# Patient Record
Sex: Female | Born: 1975 | Race: Black or African American | Hispanic: No | Marital: Married | Smoking: Never smoker
Health system: Southern US, Community
[De-identification: ages and names within clinical notes are randomized; demographics above are authoritative.]

## PROBLEM LIST (undated history)

## (undated) DIAGNOSIS — R12 Heartburn: Secondary | ICD-10-CM

## (undated) DIAGNOSIS — B977 Papillomavirus as the cause of diseases classified elsewhere: Secondary | ICD-10-CM

## (undated) DIAGNOSIS — N289 Disorder of kidney and ureter, unspecified: Secondary | ICD-10-CM

## (undated) DIAGNOSIS — T7840XA Allergy, unspecified, initial encounter: Secondary | ICD-10-CM

## (undated) DIAGNOSIS — IMO0002 Reserved for concepts with insufficient information to code with codable children: Secondary | ICD-10-CM

## (undated) DIAGNOSIS — R0602 Shortness of breath: Secondary | ICD-10-CM

## (undated) DIAGNOSIS — E0590 Thyrotoxicosis, unspecified without thyrotoxic crisis or storm: Secondary | ICD-10-CM

## (undated) DIAGNOSIS — I10 Essential (primary) hypertension: Secondary | ICD-10-CM

## (undated) DIAGNOSIS — M549 Dorsalgia, unspecified: Secondary | ICD-10-CM

## (undated) DIAGNOSIS — Z86718 Personal history of other venous thrombosis and embolism: Secondary | ICD-10-CM

## (undated) DIAGNOSIS — D649 Anemia, unspecified: Secondary | ICD-10-CM

## (undated) DIAGNOSIS — E559 Vitamin D deficiency, unspecified: Secondary | ICD-10-CM

## (undated) DIAGNOSIS — R600 Localized edema: Secondary | ICD-10-CM

## (undated) HISTORY — DX: Vitamin D deficiency, unspecified: E55.9

## (undated) HISTORY — DX: Shortness of breath: R06.02

## (undated) HISTORY — DX: Dorsalgia, unspecified: M54.9

## (undated) HISTORY — DX: Disorder of kidney and ureter, unspecified: N28.9

## (undated) HISTORY — DX: Allergy, unspecified, initial encounter: T78.40XA

## (undated) HISTORY — DX: Heartburn: R12

## (undated) HISTORY — DX: Papillomavirus as the cause of diseases classified elsewhere: B97.7

## (undated) HISTORY — DX: Essential (primary) hypertension: I10

## (undated) HISTORY — DX: Localized edema: R60.0

## (undated) HISTORY — PX: HERNIA REPAIR: SHX51

## (undated) HISTORY — DX: Reserved for concepts with insufficient information to code with codable children: IMO0002

## (undated) HISTORY — DX: Anemia, unspecified: D64.9

## (undated) HISTORY — DX: Personal history of other venous thrombosis and embolism: Z86.718

---

## 2004-03-31 ENCOUNTER — Other Ambulatory Visit: Admission: RE | Admit: 2004-03-31 | Discharge: 2004-03-31 | Payer: Self-pay | Admitting: Family Medicine

## 2007-05-02 ENCOUNTER — Encounter: Admission: RE | Admit: 2007-05-02 | Discharge: 2007-05-02 | Payer: Self-pay | Admitting: Nephrology

## 2007-05-07 ENCOUNTER — Encounter (HOSPITAL_COMMUNITY): Admission: RE | Admit: 2007-05-07 | Discharge: 2007-06-30 | Payer: Self-pay | Admitting: Nephrology

## 2007-08-08 ENCOUNTER — Ambulatory Visit (HOSPITAL_BASED_OUTPATIENT_CLINIC_OR_DEPARTMENT_OTHER): Admission: RE | Admit: 2007-08-08 | Discharge: 2007-08-08 | Payer: Self-pay | Admitting: General Surgery

## 2008-01-06 ENCOUNTER — Encounter: Admission: RE | Admit: 2008-01-06 | Discharge: 2008-01-06 | Payer: Self-pay | Admitting: Obstetrics and Gynecology

## 2008-09-24 HISTORY — PX: MYOMECTOMY: SHX85

## 2009-02-02 ENCOUNTER — Encounter: Admission: RE | Admit: 2009-02-02 | Discharge: 2009-02-02 | Payer: Self-pay | Admitting: Obstetrics and Gynecology

## 2009-02-11 ENCOUNTER — Encounter: Admission: RE | Admit: 2009-02-11 | Discharge: 2009-02-11 | Payer: Self-pay | Admitting: Obstetrics and Gynecology

## 2009-03-24 IMAGING — MG MM DIAGNOSTIC BILATERAL
8 of 9 series · 8 of 9 positions shown · non-contrast
Comparison: none

DG DIAGNOSTIC BILATERAL
Bilateral CC and MLO view(s) were taken.

RIGHT BREAST ULTRASOUND
DIGITAL BILATERAL DIAGNOSTIC MAMMOGRAM WITH CAD AND RIGHT BREAST ULTRASOUND:
CLINICAL DATA: Palpable right breast mass located within the upper outer quadrant of the right 
breast.

[R CC (1 of 2)]
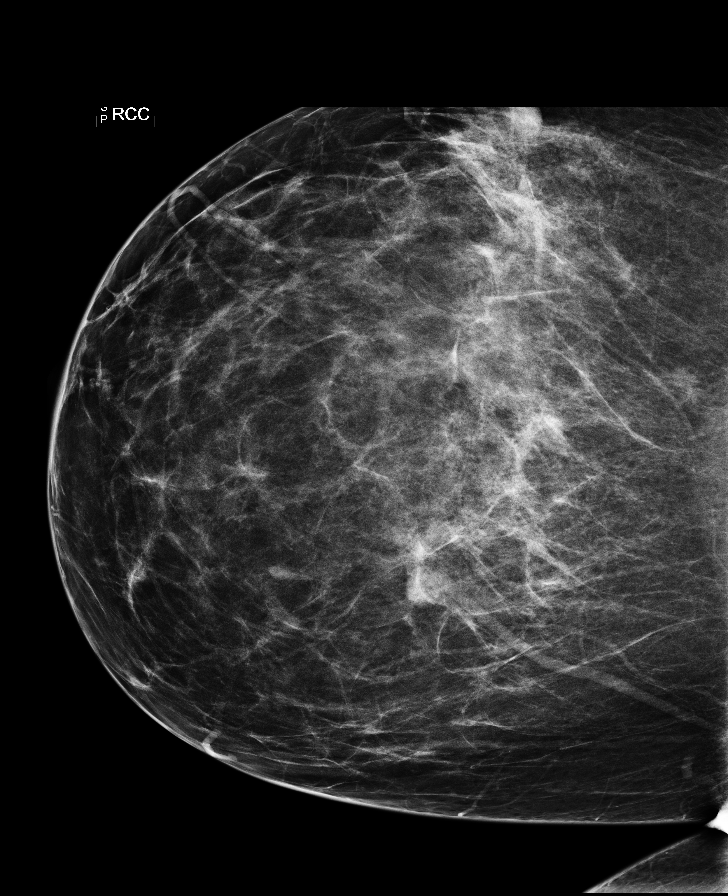

[L CC]
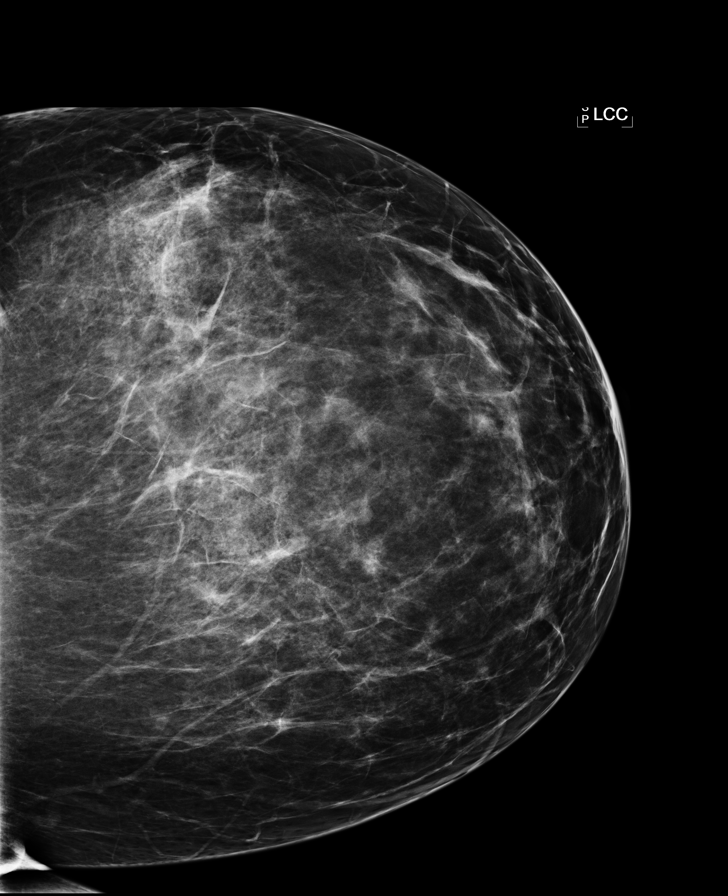

[L MLO]
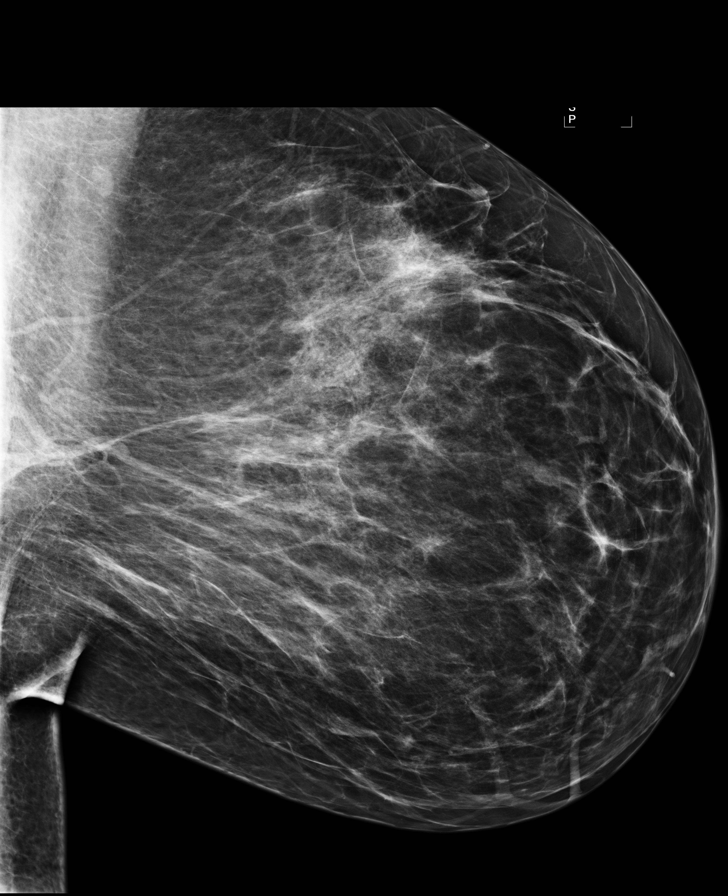

[R MLO (1 of 3)]
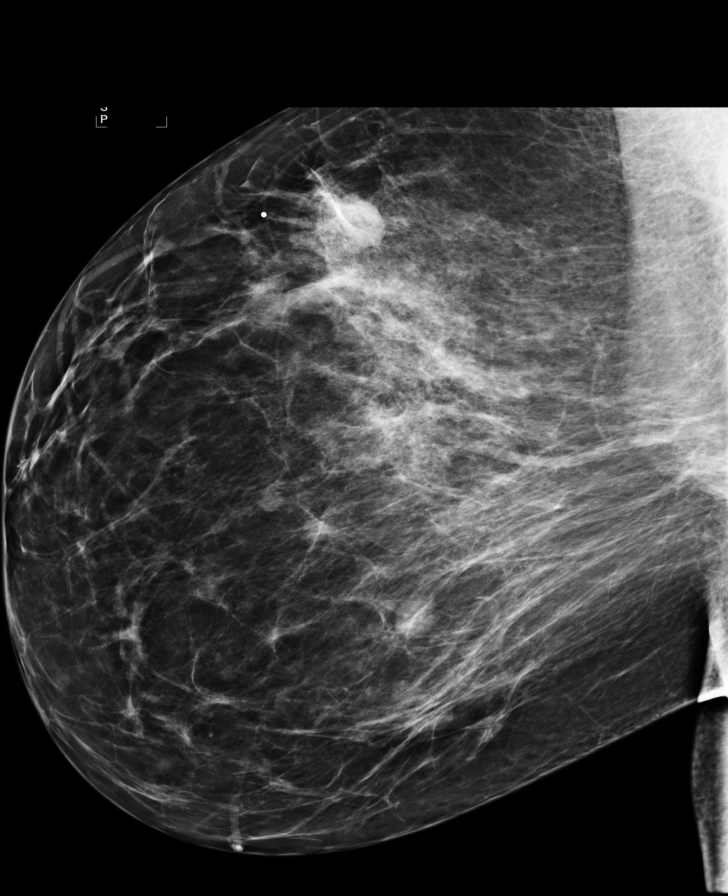

[R TAN]
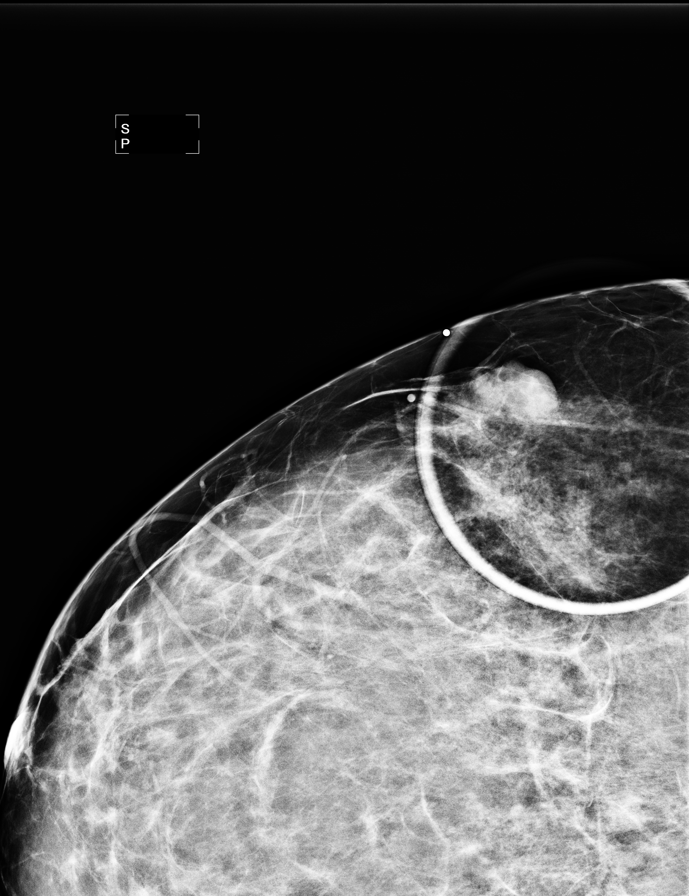

[R MLO (2 of 3)]
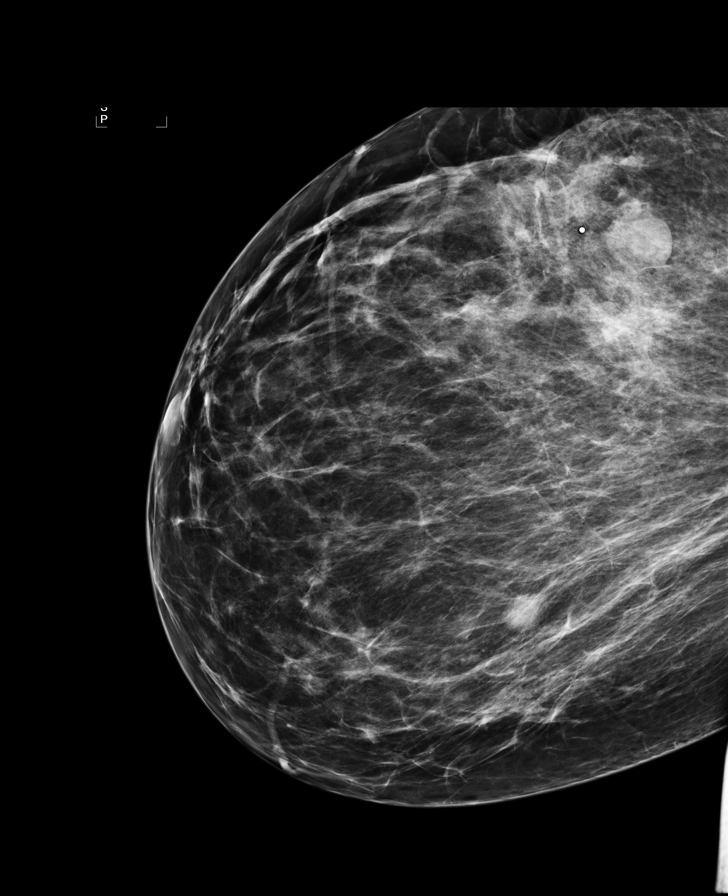

[R CC (2 of 2)]
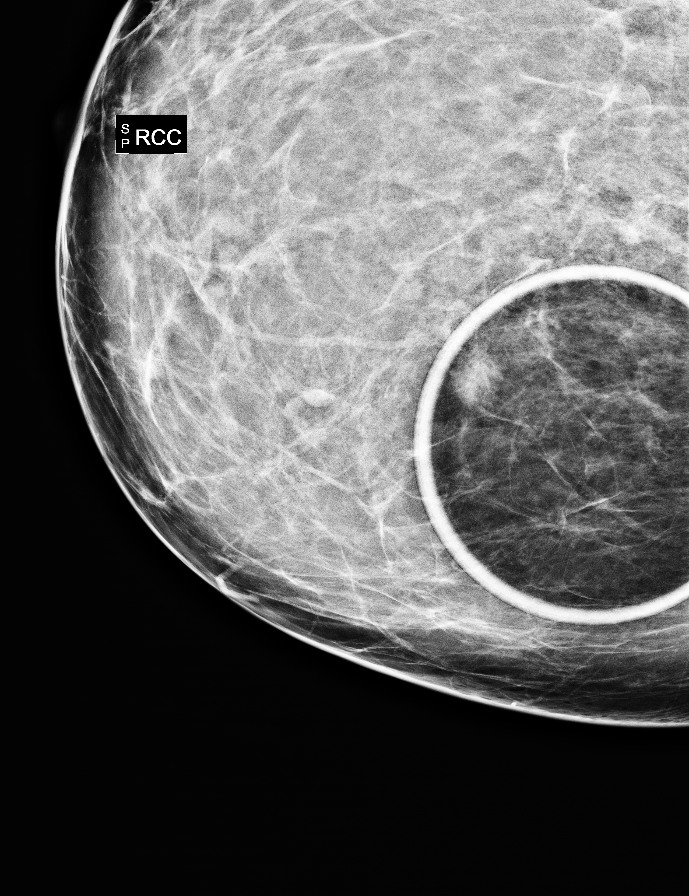

[R MLO (3 of 3)]
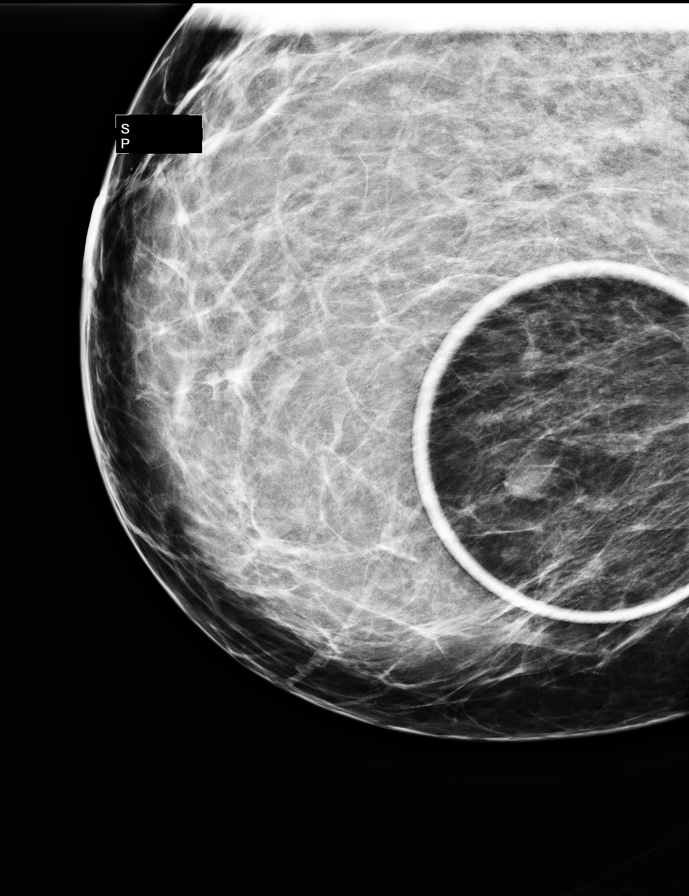

[8 of 9 positions shown; findings below may reference images not displayed]

There is a scattered fibroglandular parenchymal pattern present.  The palpable mass located within 
the upper outer quadrant of the right breast at approximately the 9:30-10 o'clock position is 
partially circumscribed by mammography.  There is also a second partially circumscribed oblong mass
located medially within the right breast at approximately the 4 o'clock position.  There is no 
left breast mass and there are no suspicious microcalcifications or areas of distortion.

On my directed physical examination of the right breast, there is a palpable mobile mass located at
the [DATE] position 15 cm from the nipple corresponding to the nodular density seen at mammography 
in this region.  On ultrasound, this is a circumscribed lobulated mass which is wider than it is 
tall and measures 1.9 x 1.1 x 1.9 cm in size.  This does contain central septations and likely 
represents a fibroadenoma.  I discussed ultrasound-guided core biopsy, surgical excision, or six 
month follow-up breast ultrasound (for a two-year period of time) with the patient.  Currently she 
would prefer to undergo ultrasound follow-up.

Ultrasound of the medial right breast demonstrates a circumscribed 7 x 9 x 9 mm in size solid 
nodule located at the 4 o'clock position 8 cm from the nipple with an eccentric echogenic area 
likely representing a fatty hilum most consistent with an intramammary lymph node.
IMPRESSION: 1.  Probably benign nodule within the lateral right breast at the 9:30-10 o'clock position 
measuring 1.9 x 1.1 x 1.9 cm in size (probable fibroadenoma).  Recommend follow-up right breast 
ultrasound in six months.  The importance of breast self-examination was also stressed with the 
patient and if the nodule increases in size, she is to contact us.
2.  7 x 9 x 9 mm in size circumscribed nodule within the medial right breast at the 4 o'clock 
position with ultrasound features most consistent with a probable intramammary lymph node.  Also 
recommend follow-up right breast diagnostic mammogram and ultrasound in six months in further 
evaluation of this area.

ASSESSMENT: Probably benign - BI-RADS 3

Follow-up diagnostic mammogram and ultrasound of the right breast in 6 months.
ANALYZED BY COMPUTER AIDED DETECTION. ,

## 2009-06-02 ENCOUNTER — Ambulatory Visit (HOSPITAL_COMMUNITY): Admission: RE | Admit: 2009-06-02 | Discharge: 2009-06-02 | Payer: Self-pay | Admitting: Endocrinology

## 2009-06-22 ENCOUNTER — Inpatient Hospital Stay (HOSPITAL_COMMUNITY): Admission: RE | Admit: 2009-06-22 | Discharge: 2009-06-24 | Payer: Self-pay | Admitting: Obstetrics and Gynecology

## 2009-06-22 ENCOUNTER — Encounter (INDEPENDENT_AMBULATORY_CARE_PROVIDER_SITE_OTHER): Payer: Self-pay | Admitting: Obstetrics and Gynecology

## 2009-09-24 HISTORY — PX: COLPOSCOPY: SHX161

## 2010-12-29 LAB — CBC
HCT: 28.8 % — ABNORMAL LOW (ref 36.0–46.0)
Hemoglobin: 9.5 g/dL — ABNORMAL LOW (ref 12.0–15.0)
MCHC: 33.1 g/dL (ref 30.0–36.0)
MCV: 80.1 fL (ref 78.0–100.0)
Platelets: 302 10*3/uL (ref 150–400)
RBC: 3.59 MIL/uL — ABNORMAL LOW (ref 3.87–5.11)
RDW: 17.3 % — ABNORMAL HIGH (ref 11.5–15.5)
WBC: 12.6 10*3/uL — ABNORMAL HIGH (ref 4.0–10.5)

## 2010-12-29 LAB — BASIC METABOLIC PANEL
CO2: 27 mEq/L (ref 19–32)
Chloride: 102 mEq/L (ref 96–112)
Creatinine, Ser: 0.87 mg/dL (ref 0.4–1.2)
GFR calc Af Amer: >60 mL/min (ref >60)
Potassium: 3.8 mEq/L (ref 3.5–5.1)
Sodium: 134 mEq/L — ABNORMAL LOW (ref 135–145)

## 2010-12-29 LAB — DIFFERENTIAL
Basophils Absolute: 0.9 10*3/uL — ABNORMAL HIGH (ref 0.0–0.1)
Eosinophils Absolute: 0 10*3/uL (ref 0.0–0.7)
Lymphocytes Relative: 26 % (ref 12–46)
Lymphs Abs: 3 10*3/uL (ref 0.7–4.0)
Monocytes Relative: 4 % (ref 3–12)

## 2010-12-29 LAB — COMPREHENSIVE METABOLIC PANEL
AST: 14 U/L (ref 0–37)
Albumin: 3.7 g/dL (ref 3.5–5.2)
Chloride: 105 mEq/L (ref 96–112)
Creatinine, Ser: 0.79 mg/dL (ref 0.4–1.2)
GFR calc Af Amer: >60 mL/min (ref >60)
Potassium: 2.8 mEq/L — ABNORMAL LOW (ref 3.5–5.1)
Total Bilirubin: 0.8 mg/dL (ref 0.3–1.2)
Total Protein: 7.5 g/dL (ref 6.0–8.3)

## 2010-12-29 LAB — TYPE AND SCREEN
ABO/RH(D): B POS
Antibody Screen: NEGATIVE

## 2010-12-29 LAB — ABO/RH: ABO/RH(D): B POS

## 2011-02-06 NOTE — Op Note (Signed)
NAMEKIYOMI, PALLO                ACCOUNT NO.:  [id number]   MEDICAL RECORD NO.:  [id number]          PATIENT TYPE:  AMB   LOCATION:  DSC                          FACILITY:  [facility]   PHYSICIAN:  Cherylynn Ridges, M.D.    DATE OF BIRTH:  [birth year]   DATE OF PROCEDURE:  08/08/2007  DATE OF DISCHARGE:                               OPERATIVE REPORT   PREOPERATIVE DIAGNOSIS:  De novo supraumbilical ventral hernia.   POSTOPERATIVE DIAGNOSIS:  De novo supraumbilical ventral hernia.   PROCEDURE:  Repair of supraumbilical ventral hernia with mesh.   SURGEON:  Cherylynn Ridges, M.D.   ASSISTANT:  None.   ANESTHESIA:  General with a laryngeal airway.   ESTIMATED BLOOD LOSS:  Less than 20 mL.   COMPLICATIONS:  None.   CONDITION:  Stable.   FINDINGS:  The patient had a 4 cm supraumbilical ventral hernia defect  with entrapped omentum.   INDICATIONS FOR OPERATION:  The patient is a 35 year old found to have a  De novo ventral hernia who comes in for repair.   OPERATION:  The patient was taken to the operating room, placed on the  table in the supine position.  After an adequate general laryngeal  airway anesthetic was administered, she was prepped and draped in the  usual sterile manner exposing the midline.   A supraumbilical midline incision longitudinal was made using a #10  blade approximately 6 cm long.  This was taken down to the subcutaneous  tissue where we subsequently dissected out the hernia sac which  protruded through.  We were easily able to get it down to the fascial  edges and dissected out the fascial margins so that we can apply our  mesh after repair of the hernia.  We opened the sac with electrocautery  and then reduced the omentum which was entrapped within.  We then cut  out the excess peritoneum hernia sac and then repaired the hernia defect  and with multiple layers of #1 Novofils.  Initially, it was closed off  using interrupted single simple stitches of #1  Novofil.  We then tacked  down an oval piece of mesh measuring approximately 5 x 3 cm in size,  tacking it down circumferentially around the repaired site using #1  Novofils.  It was tacked down in 8 different sites.  We then  subsequently repaired and closed off and did an overlying running repair  with #1 Prolene starting at the left side and running in all the way  across the mesh, tightening down the mesh in the midline to the right  side.  Once this was done, we irrigated the wound with antibiotic  solution in which the mesh had been soaked.  We then closed in 3  layers deep 3-0 Vicryl subcutaneous layers, a more superficial  subcuticular layer of 3-0 Vicryl, then the skin was closed using running  subcuticular  stitch of 4-0 Monocryl.  Dermabond, Steri-Strips and  Tegaderm were applied as dressings.  All counts were correct.      Cherylynn Ridges, M.D.  Electronically Signed  JOW/MEDQ  D:  08/08/2007  T:  08/08/2007  Job:  [id number]   cc:   Deterding, ________ Judie Petit.D.

## 2011-07-03 LAB — BASIC METABOLIC PANEL
BUN: 6
CO2: 29
Calcium: 8.9
Chloride: 107
Creatinine, Ser: 0.69
Glucose, Bld: 82

## 2011-07-03 LAB — DIFFERENTIAL
Basophils Absolute: 0
Basophils Relative: 0
Eosinophils Absolute: 0.1 — ABNORMAL LOW
Monocytes Relative: 8
Neutro Abs: 2.9
Neutrophils Relative %: 67

## 2011-07-03 LAB — CBC
MCHC: 32.9
MCV: 70.6 — ABNORMAL LOW
RDW: 16.9 — ABNORMAL HIGH

## 2012-02-06 DIAGNOSIS — IMO0002 Reserved for concepts with insufficient information to code with codable children: Secondary | ICD-10-CM | POA: Insufficient documentation

## 2012-02-06 DIAGNOSIS — N049 Nephrotic syndrome with unspecified morphologic changes: Secondary | ICD-10-CM | POA: Insufficient documentation

## 2012-02-06 DIAGNOSIS — B977 Papillomavirus as the cause of diseases classified elsewhere: Secondary | ICD-10-CM | POA: Insufficient documentation

## 2012-02-06 DIAGNOSIS — I10 Essential (primary) hypertension: Secondary | ICD-10-CM | POA: Insufficient documentation

## 2012-02-15 ENCOUNTER — Ambulatory Visit (INDEPENDENT_AMBULATORY_CARE_PROVIDER_SITE_OTHER): Payer: Managed Care, Other (non HMO) | Admitting: Obstetrics and Gynecology

## 2012-02-15 ENCOUNTER — Encounter: Payer: Self-pay | Admitting: Obstetrics and Gynecology

## 2012-02-15 VITALS — BP 106/58 | Ht 66.0 in | Wt 215.0 lb

## 2012-02-15 DIAGNOSIS — Z01419 Encounter for gynecological examination (general) (routine) without abnormal findings: Secondary | ICD-10-CM

## 2012-02-15 DIAGNOSIS — N049 Nephrotic syndrome with unspecified morphologic changes: Secondary | ICD-10-CM | POA: Insufficient documentation

## 2012-02-15 DIAGNOSIS — Z124 Encounter for screening for malignant neoplasm of cervix: Secondary | ICD-10-CM

## 2012-02-15 NOTE — Progress Notes (Signed)
The patient reports:normal menses, no abnormal bleeding, pelvic pain or discharge  Contraception:no method  Last mammogram: was abnormal: Dominant cyst  May 2010 Last pap: Was abnormal ASCUS HPV -  April 05/2011  GC/Chlamydia cultures offered: declined HIV/RPR/HbsAg offered:  declined HSV 1 and 2 glycoprotein offered: declined  Menstrual cycle regular and monthly: Yes Menstrual flow normal: Yes  Urinary symptoms: none Normal bowel movements: Yes Reports abuse at home: No  Subjective:    Sara Torres is a 36 y.o. female, G2P0, who presents for an annual exam. S/P myomectomy    History   Social History  . Marital Status: Single    Spouse Name: N/A    Number of Children: N/A  . Years of Education: N/A   Social History Main Topics  . Smoking status: Never Smoker   . Smokeless tobacco: Never Used  . Alcohol Use: No  . Drug Use: No  . Sexually Active: No   Other Topics Concern  . None   Social History Narrative  . None    Menstrual cycle:   LMP: Patient's last menstrual period was 01/12/2012.           Cycle: monthly, 5 days, heavy for 2 days: 1 pad every 2 hours, no clots, no dysmenorrhea.Cycle still a lot better than before surgery.  The following portions of the patient's history were reviewed and updated as appropriate: allergies, current medications, past family history, past medical history, past social history, past surgical history and problem list.  Review of Systems Pertinent items are noted in HPI. Breast:Negative for breast lump,nipple discharge or nipple retraction Gastrointestinal: Negative for abdominal pain, change in bowel habits or rectal bleeding Urinary:negative   Objective:    BP 106/58  Ht 5\' 6"  (1.676 m)  Wt 215 lb (97.523 kg)  BMI 34.70 kg/m2  LMP 01/12/2012    Weight:  Wt Readings from Last 1 Encounters:  02/15/12 215 lb (97.523 kg)          BMI: Body mass index is 34.70 kg/(m^2).  General Appearance: Alert, appropriate appearance  for age. No acute distress HEENT: Grossly normal Neck / Thyroid: Supple, no masses, nodes or enlargement Lungs: clear to auscultation bilaterally Back: No CVA tenderness Breast Exam: No masses or nodes.No dimpling, nipple retraction or discharge. Cardiovascular: Regular rate and rhythm. S1, S2, no murmur Gastrointestinal: Soft, non-tender, no masses or organomegaly Pelvic Exam: Vulva and vagina appear normal. Bimanual exam reveals normal uterus and adnexa. Rectovaginal: not indicated Lymphatic Exam: Non-palpable nodes in neck, clavicular, axillary, or inguinal regions Skin: no rash or abnormalities Neurologic: Normal gait and speech, no tremor  Psychiatric: Alert and oriented, appropriate affect.    Assessment:    Normal gyn exam    Plan:    pap smear return annually or prn STD screening: declined Contraception:no method      Shara Hartis AMD

## 2012-02-22 LAB — PAP IG W/ RFLX HPV ASCU

## 2012-02-25 ENCOUNTER — Telehealth: Payer: Self-pay

## 2012-02-25 LAB — HUMAN PAPILLOMAVIRUS, HIGH RISK: HPV DNA High Risk: NOT DETECTED

## 2012-02-25 NOTE — Telephone Encounter (Signed)
HPV is pending as of 02-25-12  ld

## 2012-02-27 ENCOUNTER — Encounter: Payer: Self-pay | Admitting: Obstetrics and Gynecology

## 2012-04-03 ENCOUNTER — Telehealth: Payer: Self-pay

## 2012-04-03 NOTE — Telephone Encounter (Signed)
PC from pt. Concerned about cycle changes.  Stated she had regular period then spotted slightly in between.  Notified pt that, that can happen occasionally.  If it becomes bothersome or changes increase, call for appt. Encouraged to keep cycle calendar.  Pt agreeable.  ld

## 2012-11-14 ENCOUNTER — Other Ambulatory Visit: Payer: Self-pay | Admitting: Endocrinology

## 2012-11-14 DIAGNOSIS — E0590 Thyrotoxicosis, unspecified without thyrotoxic crisis or storm: Secondary | ICD-10-CM

## 2012-12-01 ENCOUNTER — Ambulatory Visit (HOSPITAL_COMMUNITY): Payer: Self-pay

## 2012-12-02 ENCOUNTER — Other Ambulatory Visit (HOSPITAL_COMMUNITY): Payer: Self-pay

## 2012-12-04 ENCOUNTER — Encounter (HOSPITAL_COMMUNITY): Admission: RE | Admit: 2012-12-04 | Payer: Managed Care, Other (non HMO) | Source: Ambulatory Visit

## 2012-12-05 ENCOUNTER — Ambulatory Visit (HOSPITAL_COMMUNITY): Payer: Managed Care, Other (non HMO)

## 2012-12-05 ENCOUNTER — Encounter (HOSPITAL_COMMUNITY): Payer: Managed Care, Other (non HMO)

## 2012-12-18 ENCOUNTER — Encounter (HOSPITAL_COMMUNITY)
Admission: RE | Admit: 2012-12-18 | Discharge: 2012-12-18 | Disposition: A | Discharge status: Home / Self Care | Payer: Managed Care, Other (non HMO) | Source: Ambulatory Visit | Attending: Endocrinology | Admitting: Endocrinology

## 2012-12-18 DIAGNOSIS — E0590 Thyrotoxicosis, unspecified without thyrotoxic crisis or storm: Secondary | ICD-10-CM

## 2012-12-18 DIAGNOSIS — E0500 Thyrotoxicosis with diffuse goiter without thyrotoxic crisis or storm: Secondary | ICD-10-CM | POA: Insufficient documentation

## 2012-12-19 ENCOUNTER — Encounter (HOSPITAL_COMMUNITY)
Admission: RE | Admit: 2012-12-19 | Discharge: 2012-12-19 | Disposition: A | Discharge status: Home / Self Care | Payer: Managed Care, Other (non HMO) | Source: Ambulatory Visit | Attending: Endocrinology | Admitting: Endocrinology

## 2012-12-19 ENCOUNTER — Encounter (HOSPITAL_COMMUNITY): Payer: Self-pay

## 2012-12-19 ENCOUNTER — Ambulatory Visit (HOSPITAL_COMMUNITY)
Admission: RE | Admit: 2012-12-19 | Discharge: 2012-12-19 | Disposition: A | Discharge status: Home / Self Care | Payer: Managed Care, Other (non HMO) | Source: Ambulatory Visit | Attending: Endocrinology | Admitting: Endocrinology

## 2012-12-19 DIAGNOSIS — E0590 Thyrotoxicosis, unspecified without thyrotoxic crisis or storm: Secondary | ICD-10-CM

## 2012-12-19 DIAGNOSIS — E0520 Thyrotoxicosis with toxic multinodular goiter without thyrotoxic crisis or storm: Secondary | ICD-10-CM | POA: Insufficient documentation

## 2012-12-19 HISTORY — DX: Thyrotoxicosis, unspecified without thyrotoxic crisis or storm: E05.90

## 2012-12-19 MED ORDER — SODIUM IODIDE I 131 CAPSULE
15.6000 | Freq: Once | INTRAVENOUS | Status: AC | PRN
  Administered 2012-12-18: 15.6 via ORAL

## 2012-12-19 MED ORDER — SODIUM PERTECHNETATE TC 99M INJECTION
9.2000 | Freq: Once | INTRAVENOUS | Status: AC | PRN
  Administered 2012-12-19: 9.2 via INTRAVENOUS

## 2012-12-28 ENCOUNTER — Other Ambulatory Visit: Payer: Self-pay | Admitting: Endocrinology

## 2012-12-28 DIAGNOSIS — E0590 Thyrotoxicosis, unspecified without thyrotoxic crisis or storm: Secondary | ICD-10-CM

## 2013-01-08 ENCOUNTER — Encounter (HOSPITAL_COMMUNITY)
Admission: RE | Admit: 2013-01-08 | Discharge: 2013-01-08 | Disposition: A | Discharge status: Home / Self Care | Payer: Managed Care, Other (non HMO) | Source: Ambulatory Visit | Attending: Endocrinology | Admitting: Endocrinology

## 2013-01-08 DIAGNOSIS — E0590 Thyrotoxicosis, unspecified without thyrotoxic crisis or storm: Secondary | ICD-10-CM | POA: Insufficient documentation

## 2013-01-08 LAB — HCG, SERUM, QUALITATIVE: Preg, Serum: NEGATIVE

## 2013-01-08 MED ORDER — SODIUM IODIDE I 131 CAPSULE
18.6000 | Freq: Once | INTRAVENOUS | Status: AC | PRN
  Administered 2013-01-08: 18.6 via ORAL

## 2013-01-09 ENCOUNTER — Encounter (HOSPITAL_COMMUNITY): Payer: Managed Care, Other (non HMO)

## 2013-08-13 ENCOUNTER — Other Ambulatory Visit: Payer: Self-pay | Admitting: Obstetrics and Gynecology

## 2013-08-27 ENCOUNTER — Other Ambulatory Visit: Payer: Self-pay | Admitting: Obstetrics and Gynecology
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
## 2013-09-04 ENCOUNTER — Encounter (HOSPITAL_COMMUNITY): Payer: Self-pay | Admitting: Pharmacist

## 2013-09-08 ENCOUNTER — Encounter (HOSPITAL_COMMUNITY)
Admission: RE | Admit: 2013-09-08 | Discharge: 2013-09-08 | Disposition: A | Discharge status: Home / Self Care | Payer: Managed Care, Other (non HMO) | Source: Ambulatory Visit | Attending: Obstetrics and Gynecology | Admitting: Obstetrics and Gynecology

## 2013-09-08 ENCOUNTER — Encounter (HOSPITAL_COMMUNITY): Payer: Self-pay

## 2013-09-08 LAB — COMPREHENSIVE METABOLIC PANEL
ALT: 9 U/L (ref 0–35)
AST: 13 U/L (ref 0–37)
Albumin: 3.3 g/dL — ABNORMAL LOW (ref 3.5–5.2)
Alkaline Phosphatase: 108 U/L (ref 39–117)
Chloride: 101 mEq/L (ref 96–112)
Creatinine, Ser: 0.81 mg/dL (ref 0.50–1.10)
Potassium: 3.4 mEq/L — ABNORMAL LOW (ref 3.5–5.1)
Sodium: 136 mEq/L (ref 135–145)
Total Bilirubin: 0.5 mg/dL (ref 0.3–1.2)

## 2013-09-08 LAB — CBC
Platelets: 235 10*3/uL (ref 150–400)
RDW: 16.6 % — ABNORMAL HIGH (ref 11.5–15.5)
WBC: 3.6 10*3/uL — ABNORMAL LOW (ref 4.0–10.5)

## 2013-09-08 NOTE — Patient Instructions (Signed)
20 NIKKY DUBA  09/08/2013   Your procedure is scheduled on:  09/11/13  Enter through the Main Entrance of [facility] at 1015 AM.  Pick up the phone at the desk and dial [phone].   Call this number if you have problems the morning of surgery: [phone]   Remember:   Do not eat food:After Midnight.  Do not drink clear liquids: 4 Hours before arrival.  Take these medicines the morning of surgery with A SIP OF WATER: take blood pressure medications   Do not wear jewelry, make-up or nail polish.  Do not wear lotions, powders, or perfumes. You may wear deodorant.  Do not shave 48 hours prior to surgery.  Do not bring valuables to the hospital.  [facility] is not   responsible for any belongings or valuables brought to the hospital.  Contacts, dentures or bridgework may not be worn into surgery.  Leave suitcase in the car. After surgery it may be brought to your room.  For patients admitted to the hospital, checkout time is 11:00 AM the day of              discharge.   Patients discharged the day of surgery will not be allowed to drive             home.  Name and phone number of your driver: Danny Lawless   friend  Special Instructions:   Shower using CHG 2 nights before surgery and the night before surgery.  If you shower the day of surgery use CHG.  Use special wash - you have one bottle of CHG for all showers.  You should use approximately 1/3 of the bottle for each shower.   Please read over the following fact sheets that you were given:   Surgical Site Infection Prevention

## 2013-09-10 MED ORDER — GENTAMICIN SULFATE 40 MG/ML IJ SOLN
INTRAVENOUS | Status: AC
  Administered 2013-09-11: 380 mL via INTRAVENOUS
  Filled 2013-09-10: qty 9.5

## 2013-09-11 ENCOUNTER — Encounter (HOSPITAL_COMMUNITY)
Admission: RE | Disposition: A | Discharge status: Home / Self Care | Payer: Self-pay | Source: Ambulatory Visit | Attending: Obstetrics and Gynecology

## 2013-09-11 ENCOUNTER — Ambulatory Visit (HOSPITAL_COMMUNITY): Payer: Managed Care, Other (non HMO) | Admitting: Anesthesiology

## 2013-09-11 ENCOUNTER — Encounter (HOSPITAL_COMMUNITY): Payer: Managed Care, Other (non HMO) | Admitting: Anesthesiology

## 2013-09-11 ENCOUNTER — Encounter (HOSPITAL_COMMUNITY): Payer: Self-pay | Admitting: Anesthesiology

## 2013-09-11 ENCOUNTER — Ambulatory Visit (HOSPITAL_COMMUNITY)
Admission: RE | Admit: 2013-09-11 | Discharge: 2013-09-11 | Disposition: A | Discharge status: Home / Self Care | Payer: Managed Care, Other (non HMO) | Source: Ambulatory Visit | Attending: Obstetrics and Gynecology | Admitting: Obstetrics and Gynecology

## 2013-09-11 DIAGNOSIS — N938 Other specified abnormal uterine and vaginal bleeding: Secondary | ICD-10-CM | POA: Insufficient documentation

## 2013-09-11 DIAGNOSIS — I10 Essential (primary) hypertension: Secondary | ICD-10-CM | POA: Insufficient documentation

## 2013-09-11 DIAGNOSIS — N949 Unspecified condition associated with female genital organs and menstrual cycle: Secondary | ICD-10-CM | POA: Insufficient documentation

## 2013-09-11 DIAGNOSIS — D251 Intramural leiomyoma of uterus: Secondary | ICD-10-CM | POA: Insufficient documentation

## 2013-09-11 DIAGNOSIS — N840 Polyp of corpus uteri: Secondary | ICD-10-CM | POA: Insufficient documentation

## 2013-09-11 DIAGNOSIS — E0590 Thyrotoxicosis, unspecified without thyrotoxic crisis or storm: Secondary | ICD-10-CM | POA: Insufficient documentation

## 2013-09-11 DIAGNOSIS — N920 Excessive and frequent menstruation with regular cycle: Secondary | ICD-10-CM | POA: Insufficient documentation

## 2013-09-11 HISTORY — PX: DILITATION & CURRETTAGE/HYSTROSCOPY WITH VERSAPOINT RESECTION: SHX5571

## 2013-09-11 LAB — TYPE AND SCREEN: Antibody Screen: NEGATIVE

## 2013-09-11 SURGERY — DILATATION & CURETTAGE/HYSTEROSCOPY WITH VERSAPOINT RESECTION
Anesthesia: General | Site: Vagina

## 2013-09-11 MED ORDER — MIDAZOLAM HCL 2 MG/2ML IJ SOLN
INTRAMUSCULAR | Status: AC
  Filled 2013-09-11: qty 2

## 2013-09-11 MED ORDER — CHLOROPROCAINE HCL 1 % IJ SOLN
INTRAMUSCULAR | Status: AC
  Filled 2013-09-11: qty 30

## 2013-09-11 MED ORDER — MIDAZOLAM HCL 2 MG/2ML IJ SOLN
INTRAMUSCULAR | Status: DC | PRN
  Administered 2013-09-11: 1 mg via INTRAVENOUS

## 2013-09-11 MED ORDER — EPHEDRINE 5 MG/ML INJ
INTRAVENOUS | Status: AC
Start: 2013-09-11 — End: 2013-09-11
  Filled 2013-09-11: qty 10

## 2013-09-11 MED ORDER — MEPERIDINE HCL 25 MG/ML IJ SOLN: 6.2500 mg | INTRAMUSCULAR | Status: DC | PRN

## 2013-09-11 MED ORDER — METOCLOPRAMIDE HCL 5 MG/ML IJ SOLN: 10.0000 mg | Freq: Once | INTRAMUSCULAR | Status: DC | PRN

## 2013-09-11 MED ORDER — LIDOCAINE HCL (CARDIAC) 20 MG/ML IV SOLN
INTRAVENOUS | Status: DC | PRN
  Administered 2013-09-11: 30 mg via INTRAVENOUS
  Administered 2013-09-11: 50 mg via INTRAVENOUS

## 2013-09-11 MED ORDER — FENTANYL CITRATE 0.05 MG/ML IJ SOLN
INTRAMUSCULAR | Status: AC
  Filled 2013-09-11: qty 2

## 2013-09-11 MED ORDER — EPHEDRINE SULFATE 50 MG/ML IJ SOLN
INTRAMUSCULAR | Status: DC | PRN
  Administered 2013-09-11: 10 mg via INTRAVENOUS

## 2013-09-11 MED ORDER — LACTATED RINGERS IV SOLN
INTRAVENOUS | Status: DC
  Administered 2013-09-11 (×2): via INTRAVENOUS

## 2013-09-11 MED ORDER — ONDANSETRON HCL 4 MG/2ML IJ SOLN
INTRAMUSCULAR | Status: AC
  Filled 2013-09-11: qty 2

## 2013-09-11 MED ORDER — CHLOROPROCAINE HCL 1 % IJ SOLN
INTRAMUSCULAR | Status: DC | PRN
  Administered 2013-09-11: 20 mL

## 2013-09-11 MED ORDER — FENTANYL CITRATE 0.05 MG/ML IJ SOLN: 25.0000 ug | INTRAMUSCULAR | Status: DC | PRN

## 2013-09-11 MED ORDER — KETOROLAC TROMETHAMINE 30 MG/ML IJ SOLN
INTRAMUSCULAR | Status: AC
  Filled 2013-09-11: qty 1

## 2013-09-11 MED ORDER — KETOROLAC TROMETHAMINE 30 MG/ML IJ SOLN
INTRAMUSCULAR | Status: DC | PRN
  Administered 2013-09-11: 30 mg via INTRAVENOUS

## 2013-09-11 MED ORDER — LIDOCAINE HCL (CARDIAC) 20 MG/ML IV SOLN
INTRAVENOUS | Status: AC
  Filled 2013-09-11: qty 5

## 2013-09-11 MED ORDER — ONDANSETRON HCL 4 MG/2ML IJ SOLN
INTRAMUSCULAR | Status: DC | PRN
  Administered 2013-09-11: 4 mg via INTRAVENOUS

## 2013-09-11 MED ORDER — PROPOFOL 10 MG/ML IV BOLUS
INTRAVENOUS | Status: DC | PRN
  Administered 2013-09-11: 100 mg via INTRAVENOUS

## 2013-09-11 MED ORDER — TRAMADOL HCL 50 MG PO TABS: 50.0000 mg | ORAL_TABLET | Freq: Four times a day (QID) | ORAL | Status: DC | PRN

## 2013-09-11 MED ORDER — FENTANYL CITRATE 0.05 MG/ML IJ SOLN
INTRAMUSCULAR | Status: DC | PRN
  Administered 2013-09-11 (×2): 50 ug via INTRAVENOUS

## 2013-09-11 MED ORDER — PROPOFOL 10 MG/ML IV EMUL
INTRAVENOUS | Status: AC
  Filled 2013-09-11: qty 20

## 2013-09-11 SURGICAL SUPPLY — 22 items
BOOTIES KNEE HIGH SLOAN (MISCELLANEOUS) ×4 IMPLANT
CANISTER SUCT 3000ML (MISCELLANEOUS) ×2 IMPLANT
CATH ROBINSON RED A/P 16FR (CATHETERS) ×2 IMPLANT
CLOTH BEACON ORANGE TIMEOUT ST (SAFETY) ×2 IMPLANT
CONTAINER PREFILL 10% NBF 60ML (FORM) ×4 IMPLANT
CORD ACTIVE DISPOSABLE (ELECTRODE)
CORD ELECTRO ACTIVE DISP (ELECTRODE) IMPLANT
DRSG TELFA 3X8 NADH (GAUZE/BANDAGES/DRESSINGS) ×2 IMPLANT
ELECT REM PT RETURN 9FT ADLT (ELECTROSURGICAL) ×2
ELECTRODE REM PT RTRN 9FT ADLT (ELECTROSURGICAL) ×1 IMPLANT
ELECTRODE ROLLER VERSAPOINT (ELECTRODE) IMPLANT
ELECTRODE RT ANGLE VERSAPOINT (CUTTING LOOP) ×1 IMPLANT
ELECTRODE VAPORCUT 22FR (ELECTROSURGICAL) ×1 IMPLANT
GLOVE BIOGEL PI IND STRL 7.0 (GLOVE) ×2 IMPLANT
GLOVE BIOGEL PI INDICATOR 7.0 (GLOVE) ×2
GOWN STRL REIN XL XLG (GOWN DISPOSABLE) ×4 IMPLANT
LOOP ANGLED CUTTING 22FR (CUTTING LOOP) IMPLANT
PACK HYSTEROSCOPY LF (CUSTOM PROCEDURE TRAY) ×2 IMPLANT
PAD DRESSING TELFA 3X8 NADH (GAUZE/BANDAGES/DRESSINGS) ×1 IMPLANT
PAD OB MATERNITY 4.3X12.25 (PERSONAL CARE ITEMS) ×2 IMPLANT
TOWEL OR 17X24 6PK STRL BLUE (TOWEL DISPOSABLE) ×2 IMPLANT
WATER STERILE IRR 1000ML POUR (IV SOLUTION) ×1 IMPLANT

## 2013-09-11 NOTE — Anesthesia Preprocedure Evaluation (Signed)
Anesthesia Evaluation  Patient identified by MRN, date of birth, ID band Patient awake    Reviewed: Allergy & Precautions, H&P , NPO status , Patient's Chart, lab work & pertinent test results  Airway Mallampati: II TM Distance: >3 FB Neck ROM: Full    Dental no notable dental hx. (+) Teeth Intact   Pulmonary neg pulmonary ROS,  breath sounds clear to auscultation  Pulmonary exam normal       Cardiovascular hypertension, Pt. on medications Rhythm:Regular Rate:Normal     Neuro/Psych negative neurological ROS  negative psych ROS   GI/Hepatic Neg liver ROS, Ulcer at Cardioesophageal junction   Endo/Other  Hyperthyroidism   Renal/GU Renal diseaseNephrotic syndrome  negative genitourinary   Musculoskeletal negative musculoskeletal ROS (+)   Abdominal   Peds  Hematology negative hematology ROS (+)   Anesthesia Other Findings   Reproductive/Obstetrics Menorrhagia Submucosal fibroid                           Anesthesia Physical Anesthesia Plan  ASA: II  Anesthesia Plan: General   Post-op Pain Management:    Induction: Intravenous  Airway Management Planned: Natural Airway  Additional Equipment:   Intra-op Plan:   Post-operative Plan: Extubation in OR  Informed Consent: I have reviewed the patients History and Physical, chart, labs and discussed the procedure including the risks, benefits and alternatives for the proposed anesthesia with the patient or authorized representative who has indicated his/her understanding and acceptance.   Dental advisory given  Plan Discussed with: CRNA, Anesthesiologist and Surgeon  Anesthesia Plan Comments:         Anesthesia Quick Evaluation

## 2013-09-11 NOTE — Transfer of Care (Signed)
Immediate Anesthesia Transfer of Care Note  Patient: Sara Torres  Procedure(s) Performed: Procedure(s): DILATATION & CURETTAGE/HYSTEROSCOPY WITH VERSAPOINT RESECTION (N/A)  Patient Location: PACU  Anesthesia Type:General  Level of Consciousness: awake, sedated and patient cooperative  Airway & Oxygen Therapy: Patient Spontanous Breathing and Patient connected to nasal cannula oxygen  Post-op Assessment: Report given to PACU RN and Post -op Vital signs reviewed and stable  Post vital signs: Reviewed and stable  Complications: No apparent anesthesia complications

## 2013-09-11 NOTE — Op Note (Signed)
Preoperative diagnosis: Menorrhagia with endometrial polyp  Postoperative diagnosis: Same  Anesthesia: IV sedation  Anesthesiologist: Dr. Cristela Blue  Procedure: Hysteroscopy with resection of endometrial polyp and dilation and curettage  Surgeon: Dr. Estanislado Pandy  Estimated blood loss: Minimal  Procedure:  After being informed of the planned procedure with possible complications including but not limited to bleeding, infection, injury and or perforation of uterus, injury to cervix, informed consent was obtained and patient was taken to or #8. She was given IV sedation without complication. She was placed in the lithotomy position, prepped and draped in a sterile fashion. Her bladder was emptied with an in and out red rubber catheter.  Pelvic exam reveals an anteverted uterus approximately 10 weeks in size, patient known for uterine fibroids, mobile. Adnexa are normal.  A weighted speculum is inserted in the vagina and we perform a paracervical block using 20 cc of Nesacaine 1% in the usual fashion. Anterior lip of the cervix is grasped with a tenaculum forcep and the cervix was easily dilated using Hegar dilator on total #30 one. Uterus was then sounded at 9 cm.  The VersaPoint hysteroscope was then inserted easily into the uterine cavity and with the perfusion of normal saline at a maximum pressure of 90 mm of mercury, we are able to visualize the entire uterine cavity which shows a thickened endometrium and a large 2-1/2 cm endometrial polyp arising from the left posterior wall. Both tubal ostia are visualized and normal. We attempted resection using the VersaPoint loop but due tube malfunction of the equipment aborted the procedure and modified to a normal resectoscope. The uterine cavity was flushed with glycine. We then successfully resected the previously mentioned endometrial polyp. Hysteroscope is removed and a sharp curet is used to curet the endometrial cavity removing a significant  amount of normal-appearing endometrium. The hysteroscope is reinserted and the uterine cavity to confirm a normal cavity.  Instruments are removed. Instrument and sponge count is complete x2. Estimated blood loss is minimal. Fluid deficit is 130 cc of glycine. The procedure is well tolerated by the patient is taken to recovery room in a well and stable condition.  Specimen: Endometrial polyp and endometrial curettings sent to pathology

## 2013-09-11 NOTE — Interval H&P Note (Signed)
History and Physical Interval Note:  09/11/2013 10:24 AM  Sara Torres  has presented today for surgery, with the diagnosis of  Sub-mucosal Fibroid   The various methods of treatment have been discussed with the patient and family. After consideration of risks, benefits and other options for treatment, the patient has consented to  Procedure(s): DILATATION & CURETTAGE/HYSTEROSCOPY WITH VERSAPOINT RESECTION (N/A) as a surgical intervention .  The patient's history has been reviewed, patient examined, no change in status, stable for surgery.  I have reviewed the patient's chart and labs.  Questions were answered to the patient's satisfaction.  Risk of uterine perforation / cervical injury reviewed since patient did not use Cytotec pre-operatively as instructed. Patient still desires to proceed today.   Dalyn Becker A

## 2013-09-11 NOTE — Anesthesia Postprocedure Evaluation (Signed)
  Anesthesia Post-op Note  Patient: Sara Torres  Procedure(s) Performed: Procedure(s): DILATATION & CURETTAGE/HYSTEROSCOPY WITH VERSAPOINT RESECTION (N/A)  Patient Location: PACU  Anesthesia Type:General  Level of Consciousness: awake, alert  and oriented  Airway and Oxygen Therapy: Patient Spontanous Breathing  Post-op Pain: none  Post-op Assessment: Post-op Vital signs reviewed, Patient's Cardiovascular Status Stable, Respiratory Function Stable, Patent Airway, No signs of Nausea or vomiting and Pain level controlled  Post-op Vital Signs: Reviewed and stable  Complications: No apparent anesthesia complications
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
## 2013-09-14 ENCOUNTER — Encounter (HOSPITAL_COMMUNITY): Payer: Self-pay | Admitting: Obstetrics and Gynecology

## 2014-03-07 IMAGING — US US SOFT TISSUE HEAD/NECK
1 series · 13 of 25 positions shown · non-contrast
Comparison: 02/02/2009

CLINICAL DATA: Hyperthyroidism and history of multinodular goiter.

THYROID ULTRASOUND
TECHNIQUE: Ultrasound examination of the thyroid gland and adjacent
soft tissues was performed.

[Series 1: us soft tissue head/neck · 0.07mm/px · 13 of 52 slices shown]
[im 1/52]
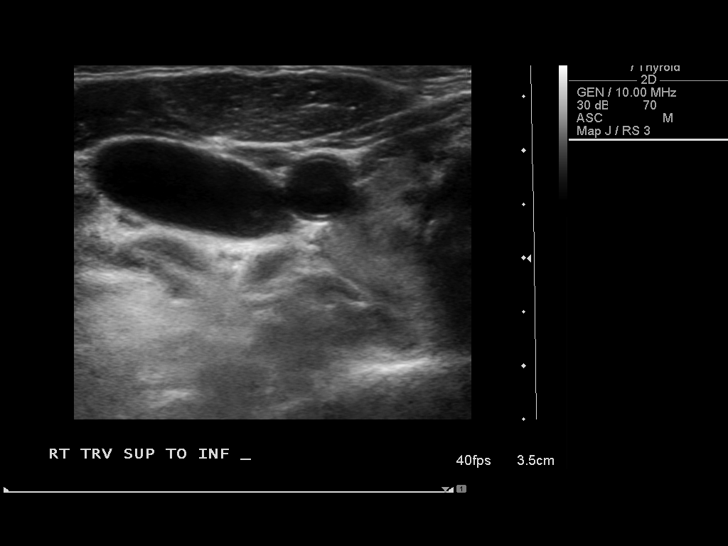
[im 5/52]
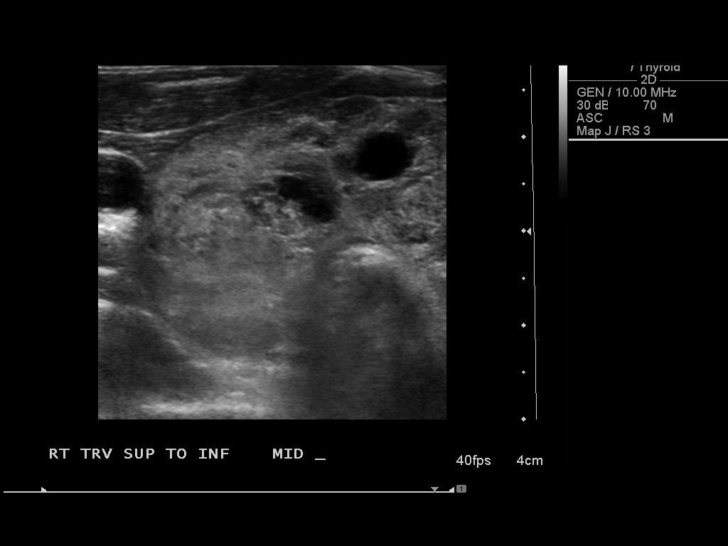
[im 9/52]
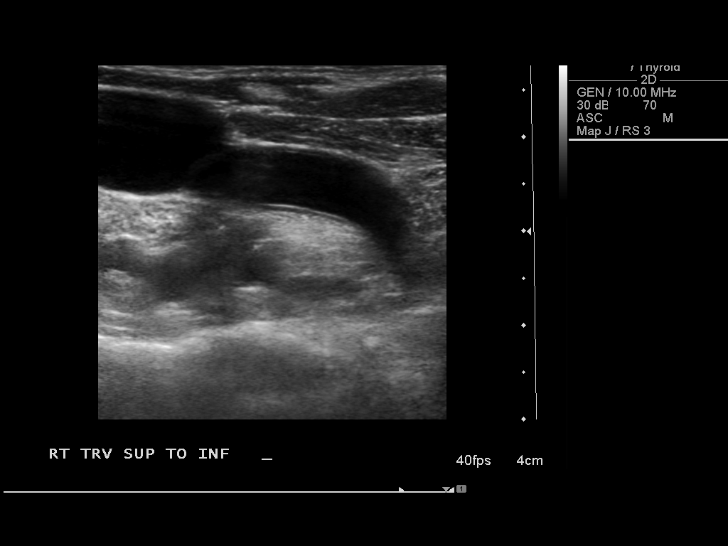
[im 13/52]
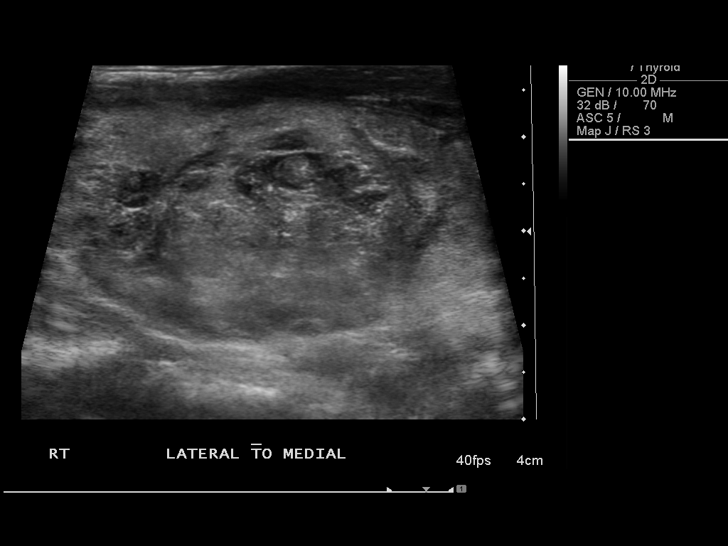
[im 18/52]
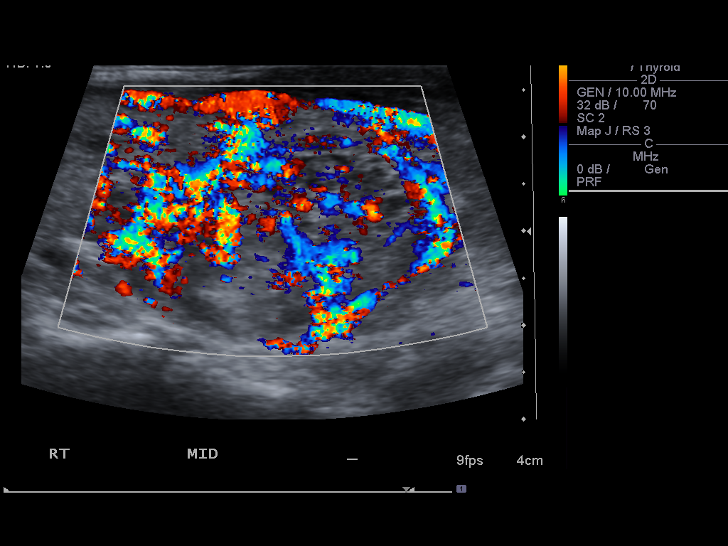
[im 22/52]
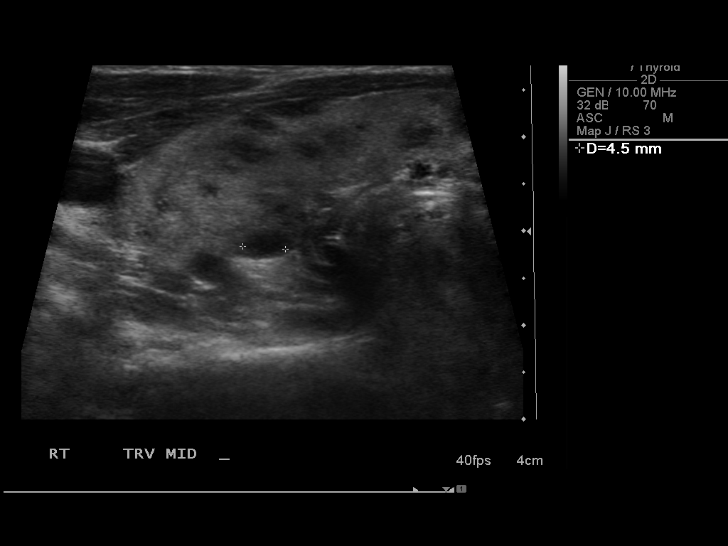
[im 26/52]
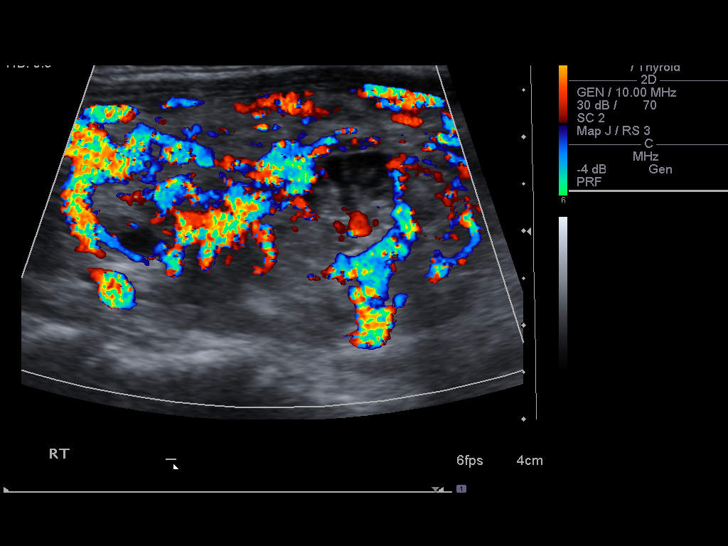
[im 30/52]
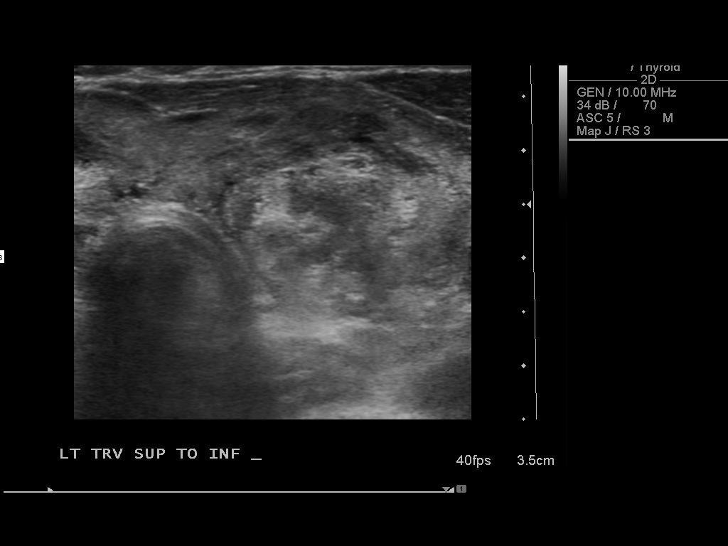
[im 35/52]
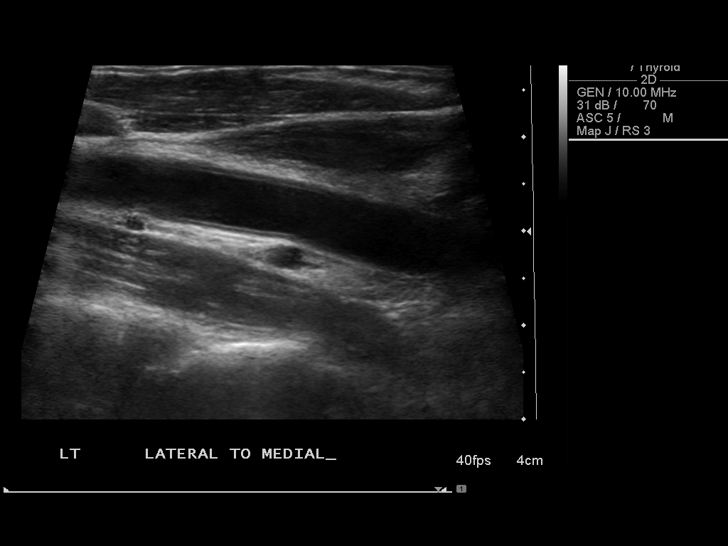
[im 39/52]
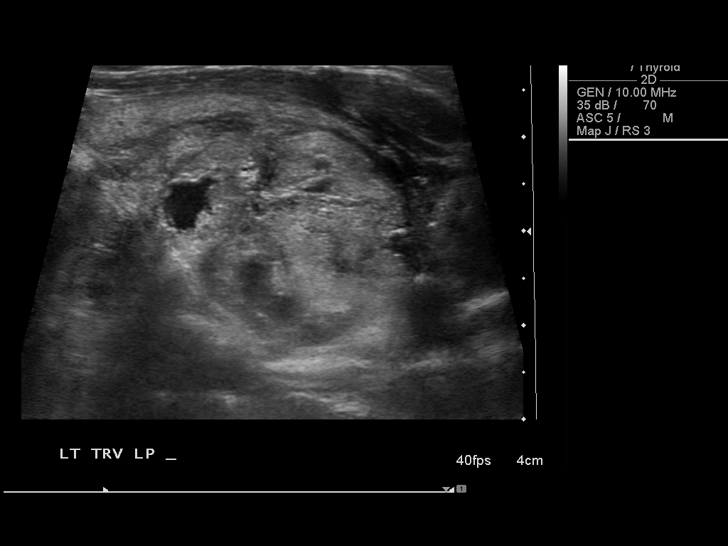
[im 43/52]
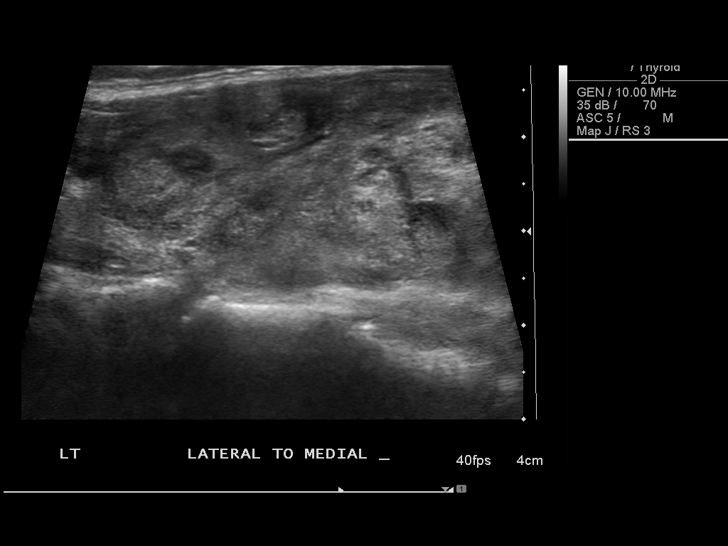
[im 47/52]
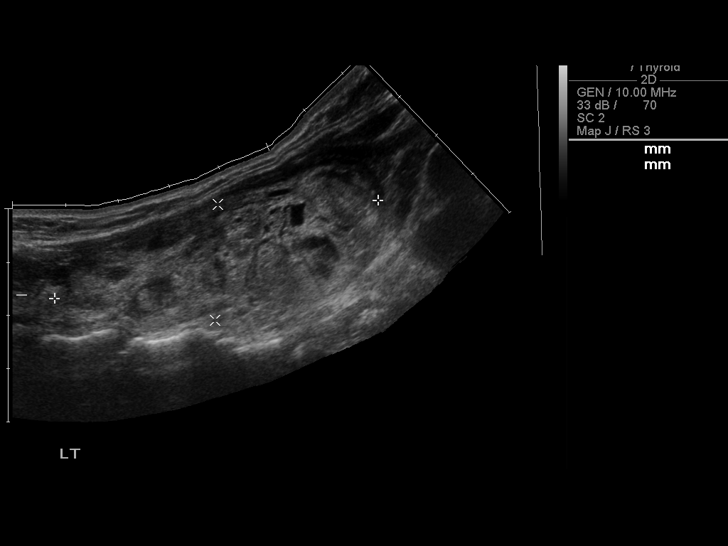
[im 52/52]
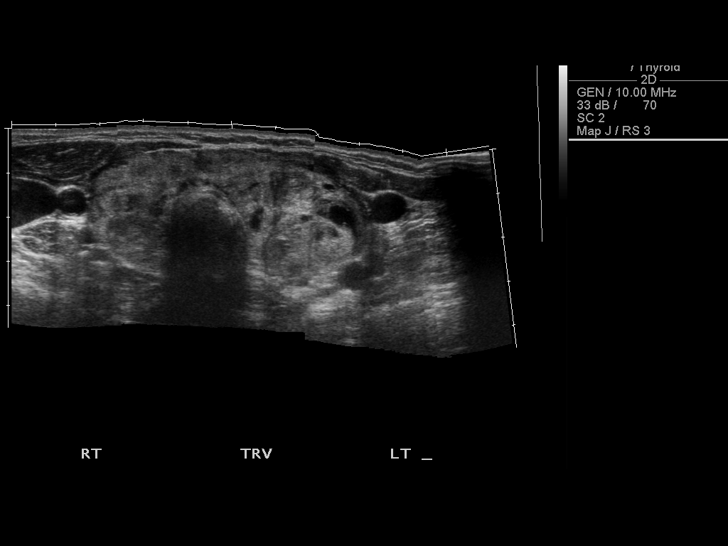

[13 of 25 positions shown; findings below may reference images not displayed]

FINDINGS: Right thyroid lobe:  6.6 x 2.4 x 2.3 cm
Left thyroid lobe:  6.4 x 2.2 x 3.1 cm
Isthmus:  1.5 cm

Overall thyroid echotexture appears more heterogeneous compared to
the prior ultrasound study.

Focal nodules:  Multinodular gland again noted.  The dominant
predominately solid and partially cystic nodule in the right lobe
shows probable slight increase in size with current dimensions of
approximately 2.9 x 2.3 x 1.9 cm.  Maximal diameter previously was
2.7 cm.  The rest the right lobe contains innumerable number of
small hypoechoic nodules that are all under 1 cm in diameter.

The dominant and previously demonstrated solid and partially cystic
nodule of the left lobe also shows probable slight increase in size
with maximal dimensions currently of approximately 3.5 x 2.1 x
cm.  The previous measurements were approximately 3.4 x 1.7 x
cm.

Lymphadenopathy:  None visualized.
IMPRESSION: Multinodular goiter with increased heterogeneity of the thyroid
gland and also larger overall thyroid gland volume.  The dominant
bilateral thyroid nodules show mild increase in size and volume
compared to the prior study in 3030.

## 2014-03-27 IMAGING — NM NM RAI THERAPY FOR HYPERTHYROIDISM
1 series · 1 of 1 positions shown · non-contrast
Comparison: Thyroid scan and uptake 12/19/2012.

CLINICAL DATA: Hyperthyroidism.

NUCLEAR MEDICINE RADIOACTIVE IODINE THERAPY FOR HYPERTHYROIDISM
TECHNIQUE: The risks and benefits of radioactive iodine therapy
were discussed with the patient in detail. Alternative therapies
were also mentioned. Radiation safety was discussed with the
patient, including how to protect the general public from exposure.
There were no barriers to communication.  Written consent was
obtained.  The patient then received a capsule containing the
radiopharmaceutical.  The patient will follow-up with the referring
physician.
Radiopharmaceutical: 6M.4UUQQU TIGER 4-YIY SODIUM IODIDE I 131
CAPSULE

[static - general purpose · 1 of 1 slices shown]
[im 1/1]
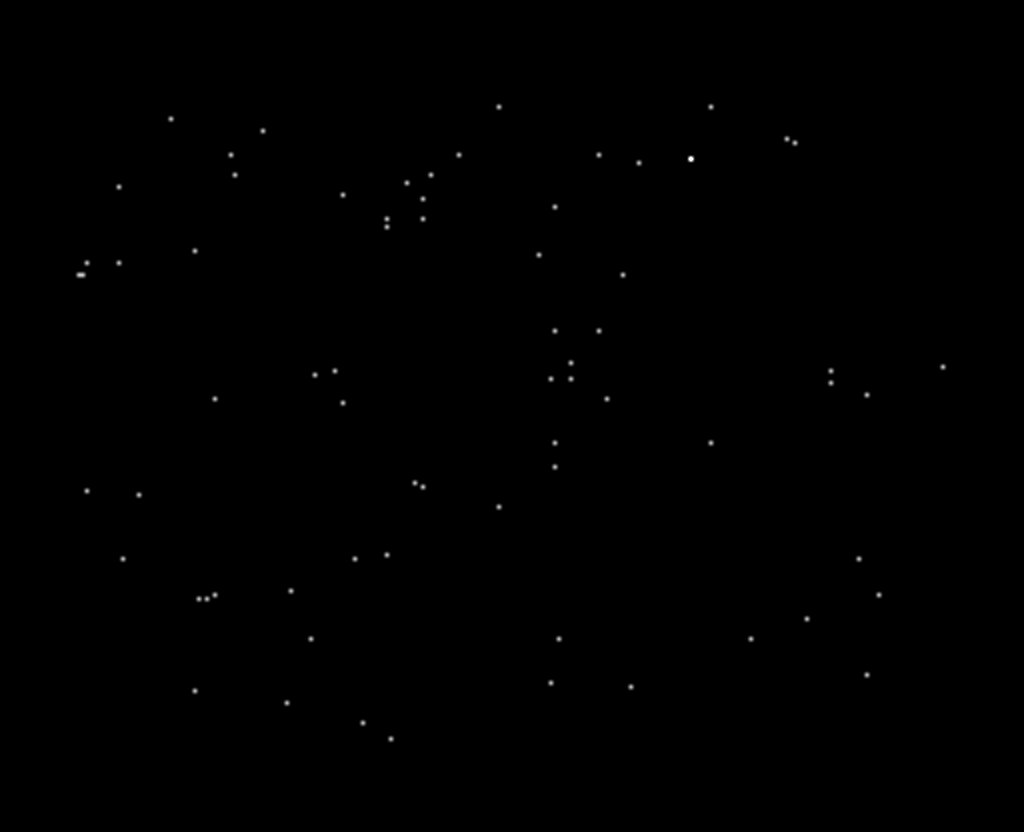

[1 of 1 positions shown; findings below may reference images not displayed]

IMPRESSION: Treatment of hyperthyroidism with 18.6 mCi 4-YIY orally.

## 2014-07-26 ENCOUNTER — Encounter (HOSPITAL_COMMUNITY): Payer: Self-pay | Admitting: Obstetrics and Gynecology

## 2015-07-22 ENCOUNTER — Other Ambulatory Visit (HOSPITAL_COMMUNITY): Payer: Self-pay | Admitting: *Deleted

## 2015-07-25 ENCOUNTER — Ambulatory Visit (HOSPITAL_COMMUNITY)
Admission: RE | Admit: 2015-07-25 | Discharge: 2015-07-25 | Disposition: A | Discharge status: Home / Self Care | Payer: Managed Care, Other (non HMO) | Source: Ambulatory Visit | Attending: Nephrology | Admitting: Nephrology

## 2015-07-25 DIAGNOSIS — D509 Iron deficiency anemia, unspecified: Secondary | ICD-10-CM | POA: Diagnosis present

## 2015-07-25 MED ORDER — FERUMOXYTOL INJECTION 510 MG/17 ML
510.0000 mg | INTRAVENOUS | Status: DC
  Administered 2015-07-25: 13:00:00 510 mg via INTRAVENOUS
  Filled 2015-07-25: qty 17

## 2015-08-01 ENCOUNTER — Ambulatory Visit (HOSPITAL_COMMUNITY)
Admission: RE | Admit: 2015-08-01 | Discharge: 2015-08-01 | Disposition: A | Discharge status: Home / Self Care | Payer: Managed Care, Other (non HMO) | Source: Ambulatory Visit | Attending: Nephrology | Admitting: Nephrology

## 2015-08-01 DIAGNOSIS — D509 Iron deficiency anemia, unspecified: Secondary | ICD-10-CM | POA: Diagnosis present

## 2015-08-01 MED ORDER — SODIUM CHLORIDE 0.9 % IV SOLN
510.0000 mg | INTRAVENOUS | Status: DC
  Administered 2015-08-01: 510 mg via INTRAVENOUS
  Filled 2015-08-01: qty 17

## 2015-09-08 ENCOUNTER — Other Ambulatory Visit: Payer: Self-pay | Admitting: Obstetrics and Gynecology

## 2015-09-08 DIAGNOSIS — N6310 Unspecified lump in the right breast, unspecified quadrant: Secondary | ICD-10-CM

## 2015-10-07 ENCOUNTER — Other Ambulatory Visit: Payer: Managed Care, Other (non HMO)

## 2016-03-23 ENCOUNTER — Other Ambulatory Visit (HOSPITAL_COMMUNITY): Payer: Self-pay | Admitting: *Deleted

## 2016-03-26 ENCOUNTER — Ambulatory Visit
Admission: RE | Admit: 2016-03-26 | Discharge: 2016-03-26 | Disposition: A | Discharge status: Home / Self Care | Payer: Managed Care, Other (non HMO) | Source: Ambulatory Visit | Attending: Obstetrics and Gynecology | Admitting: Obstetrics and Gynecology

## 2016-03-26 ENCOUNTER — Ambulatory Visit (HOSPITAL_COMMUNITY)
Admission: RE | Admit: 2016-03-26 | Discharge: 2016-03-26 | Disposition: A | Discharge status: Home / Self Care | Payer: Managed Care, Other (non HMO) | Source: Ambulatory Visit | Attending: Nephrology | Admitting: Nephrology

## 2016-03-26 DIAGNOSIS — N6310 Unspecified lump in the right breast, unspecified quadrant: Secondary | ICD-10-CM

## 2016-03-26 DIAGNOSIS — D509 Iron deficiency anemia, unspecified: Secondary | ICD-10-CM | POA: Diagnosis present

## 2016-03-26 MED ORDER — SODIUM CHLORIDE 0.9 % IV SOLN
510.0000 mg | INTRAVENOUS | Status: DC
  Administered 2016-03-26: 13:00:00 510 mg via INTRAVENOUS
  Filled 2016-03-26: qty 17

## 2016-04-03 ENCOUNTER — Ambulatory Visit (HOSPITAL_COMMUNITY)
Admission: RE | Admit: 2016-04-03 | Discharge: 2016-04-03 | Disposition: A | Discharge status: Home / Self Care | Payer: Managed Care, Other (non HMO) | Source: Ambulatory Visit | Attending: Nephrology | Admitting: Nephrology

## 2016-04-03 DIAGNOSIS — D509 Iron deficiency anemia, unspecified: Secondary | ICD-10-CM | POA: Insufficient documentation

## 2016-04-03 MED ORDER — SODIUM CHLORIDE 0.9 % IV SOLN
510.0000 mg | INTRAVENOUS | Status: AC
  Administered 2016-04-03: 510 mg via INTRAVENOUS
  Filled 2016-04-03: qty 17

## 2017-05-10 ENCOUNTER — Encounter (HOSPITAL_COMMUNITY): Payer: Self-pay

## 2017-05-13 ENCOUNTER — Other Ambulatory Visit (HOSPITAL_COMMUNITY): Payer: Self-pay | Admitting: *Deleted

## 2017-05-14 ENCOUNTER — Inpatient Hospital Stay (HOSPITAL_COMMUNITY): Admission: RE | Admit: 2017-05-14 | Payer: Self-pay | Source: Ambulatory Visit

## 2017-05-14 ENCOUNTER — Encounter (HOSPITAL_COMMUNITY)
Admission: RE | Admit: 2017-05-14 | Discharge: 2017-05-14 | Disposition: A | Discharge status: Home / Self Care | Payer: Federal, State, Local not specified - PPO | Source: Ambulatory Visit | Attending: Nephrology | Admitting: Nephrology

## 2017-05-14 DIAGNOSIS — D509 Iron deficiency anemia, unspecified: Secondary | ICD-10-CM | POA: Diagnosis not present

## 2017-05-14 MED ORDER — SODIUM CHLORIDE 0.9 % IV SOLN
510.0000 mg | INTRAVENOUS | Status: DC
  Administered 2017-05-14: 09:00:00 510 mg via INTRAVENOUS
  Filled 2017-05-14: qty 17

## 2017-05-23 ENCOUNTER — Other Ambulatory Visit (HOSPITAL_COMMUNITY): Payer: Self-pay | Admitting: *Deleted

## 2017-05-24 ENCOUNTER — Encounter (HOSPITAL_COMMUNITY)
Admission: RE | Admit: 2017-05-24 | Discharge: 2017-05-24 | Disposition: A | Discharge status: Home / Self Care | Payer: Federal, State, Local not specified - PPO | Source: Ambulatory Visit | Attending: Nephrology | Admitting: Nephrology

## 2017-05-24 ENCOUNTER — Encounter (HOSPITAL_COMMUNITY): Payer: Self-pay

## 2017-05-24 DIAGNOSIS — D509 Iron deficiency anemia, unspecified: Secondary | ICD-10-CM | POA: Diagnosis present

## 2017-05-24 MED ORDER — SODIUM CHLORIDE 0.9 % IV SOLN
510.0000 mg | INTRAVENOUS | Status: AC
  Administered 2017-05-24: 510 mg via INTRAVENOUS
  Filled 2017-05-24: qty 17

## 2017-06-20 ENCOUNTER — Other Ambulatory Visit: Payer: Self-pay | Admitting: Obstetrics and Gynecology

## 2017-06-20 DIAGNOSIS — Z1231 Encounter for screening mammogram for malignant neoplasm of breast: Secondary | ICD-10-CM

## 2017-06-25 ENCOUNTER — Ambulatory Visit
Admission: RE | Admit: 2017-06-25 | Discharge: 2017-06-25 | Disposition: A | Discharge status: Home / Self Care | Payer: Federal, State, Local not specified - PPO | Source: Ambulatory Visit | Attending: Obstetrics and Gynecology | Admitting: Obstetrics and Gynecology

## 2017-06-25 DIAGNOSIS — Z1231 Encounter for screening mammogram for malignant neoplasm of breast: Secondary | ICD-10-CM

## 2018-04-22 ENCOUNTER — Encounter (HOSPITAL_COMMUNITY): Payer: Self-pay | Admitting: Emergency Medicine

## 2018-04-22 ENCOUNTER — Emergency Department (HOSPITAL_COMMUNITY): Payer: Federal, State, Local not specified - PPO

## 2018-04-22 ENCOUNTER — Observation Stay (HOSPITAL_COMMUNITY)
Admission: EM | Admit: 2018-04-22 | Discharge: 2018-04-23 | Disposition: A | Discharge status: Home / Self Care | Payer: Federal, State, Local not specified - PPO | Attending: Emergency Medicine | Admitting: Emergency Medicine

## 2018-04-22 DIAGNOSIS — D259 Leiomyoma of uterus, unspecified: Secondary | ICD-10-CM | POA: Diagnosis not present

## 2018-04-22 DIAGNOSIS — D649 Anemia, unspecified: Secondary | ICD-10-CM | POA: Diagnosis not present

## 2018-04-22 DIAGNOSIS — I10 Essential (primary) hypertension: Secondary | ICD-10-CM | POA: Diagnosis present

## 2018-04-22 DIAGNOSIS — N83292 Other ovarian cyst, left side: Secondary | ICD-10-CM | POA: Diagnosis present

## 2018-04-22 DIAGNOSIS — Z7982 Long term (current) use of aspirin: Secondary | ICD-10-CM | POA: Insufficient documentation

## 2018-04-22 DIAGNOSIS — E039 Hypothyroidism, unspecified: Secondary | ICD-10-CM | POA: Insufficient documentation

## 2018-04-22 DIAGNOSIS — N939 Abnormal uterine and vaginal bleeding, unspecified: Secondary | ICD-10-CM | POA: Diagnosis present

## 2018-04-22 DIAGNOSIS — N049 Nephrotic syndrome with unspecified morphologic changes: Secondary | ICD-10-CM

## 2018-04-22 DIAGNOSIS — N83202 Unspecified ovarian cyst, left side: Secondary | ICD-10-CM | POA: Diagnosis not present

## 2018-04-22 DIAGNOSIS — N938 Other specified abnormal uterine and vaginal bleeding: Secondary | ICD-10-CM | POA: Diagnosis not present

## 2018-04-22 DIAGNOSIS — Z79899 Other long term (current) drug therapy: Secondary | ICD-10-CM | POA: Diagnosis not present

## 2018-04-22 LAB — CBC
HEMATOCRIT: 17.4 % — AB (ref 36.0–46.0)
Hemoglobin: 4.3 g/dL — CL (ref 12.0–15.0)
MCH: 17.6 pg — AB (ref 26.0–34.0)
MCHC: 24.7 g/dL — ABNORMAL LOW (ref 30.0–36.0)
MCV: 71 fL — ABNORMAL LOW (ref 78.0–100.0)
Platelets: 292 10*3/uL (ref 150–400)
RBC: 2.45 MIL/uL — AB (ref 3.87–5.11)
RDW: 24 % — ABNORMAL HIGH (ref 11.5–15.5)
WBC: 4.3 10*3/uL (ref 4.0–10.5)

## 2018-04-22 LAB — ABO/RH: ABO/RH(D): B POS

## 2018-04-22 LAB — FERRITIN: Ferritin: 8 ng/mL — ABNORMAL LOW (ref 11–307)

## 2018-04-22 LAB — COMPREHENSIVE METABOLIC PANEL
ALT: 10 U/L (ref 0–44)
AST: 13 U/L — AB (ref 15–41)
Albumin: 3.4 g/dL — ABNORMAL LOW (ref 3.5–5.0)
Alkaline Phosphatase: 61 U/L (ref 38–126)
Anion gap: 8 (ref 5–15)
BUN: 8 mg/dL (ref 6–20)
CHLORIDE: 104 mmol/L (ref 98–111)
CO2: 26 mmol/L (ref 22–32)
Calcium: 8.9 mg/dL (ref 8.9–10.3)
Creatinine, Ser: 0.93 mg/dL (ref 0.44–1.00)
Glucose, Bld: 100 mg/dL — ABNORMAL HIGH (ref 70–99)
POTASSIUM: 4 mmol/L (ref 3.5–5.1)
SODIUM: 138 mmol/L (ref 135–145)
Total Bilirubin: 1.2 mg/dL (ref 0.3–1.2)
Total Protein: 6.9 g/dL (ref 6.5–8.1)

## 2018-04-22 LAB — PREPARE RBC (CROSSMATCH)

## 2018-04-22 LAB — RETICULOCYTES
RBC.: 2.41 MIL/uL — AB (ref 3.87–5.11)
RETIC COUNT ABSOLUTE: 159.1 10*3/uL (ref 19.0–186.0)
RETIC CT PCT: 6.6 % — AB (ref 0.4–3.1)

## 2018-04-22 LAB — IRON AND TIBC
Iron: 17 ug/dL — ABNORMAL LOW (ref 28–170)
Saturation Ratios: 5 % — ABNORMAL LOW (ref 10.4–31.8)
TIBC: 356 ug/dL (ref 250–450)
UIBC: 339 ug/dL

## 2018-04-22 LAB — VITAMIN B12: VITAMIN B 12: 160 pg/mL — AB (ref 180–914)

## 2018-04-22 LAB — I-STAT BETA HCG BLOOD, ED (MC, WL, AP ONLY)

## 2018-04-22 LAB — FOLATE: FOLATE: 15.2 ng/mL (ref >5.9)

## 2018-04-22 MED ORDER — LABETALOL HCL 200 MG PO TABS
200.0000 mg | ORAL_TABLET | Freq: Every day | ORAL | Status: DC
  Filled 2018-04-22: qty 1

## 2018-04-22 MED ORDER — SENNOSIDES-DOCUSATE SODIUM 8.6-50 MG PO TABS: 1.0000 | ORAL_TABLET | Freq: Every evening | ORAL | Status: DC | PRN

## 2018-04-22 MED ORDER — SODIUM CHLORIDE 0.9 % IV SOLN: 250.0000 mL | INTRAVENOUS | Status: DC | PRN

## 2018-04-22 MED ORDER — ACETAMINOPHEN 650 MG RE SUPP: 650.0000 mg | Freq: Four times a day (QID) | RECTAL | Status: DC | PRN

## 2018-04-22 MED ORDER — SODIUM CHLORIDE 0.9% FLUSH
3.0000 mL | Freq: Two times a day (BID) | INTRAVENOUS | Status: DC
  Administered 2018-04-23: 3 mL via INTRAVENOUS

## 2018-04-22 MED ORDER — SODIUM CHLORIDE 0.9% FLUSH: 3.0000 mL | INTRAVENOUS | Status: DC | PRN

## 2018-04-22 MED ORDER — SODIUM CHLORIDE 0.9 % IV SOLN
10.0000 mL/h | Freq: Once | INTRAVENOUS | Status: AC
  Administered 2018-04-22: 10 mL/h via INTRAVENOUS

## 2018-04-22 MED ORDER — ACETAMINOPHEN 325 MG PO TABS: 650.0000 mg | ORAL_TABLET | Freq: Four times a day (QID) | ORAL | Status: DC | PRN

## 2018-04-22 MED ORDER — IBUPROFEN 200 MG PO TABS: 400.0000 mg | ORAL_TABLET | Freq: Four times a day (QID) | ORAL | Status: DC | PRN

## 2018-04-22 MED ORDER — LEVOTHYROXINE SODIUM 125 MCG PO TABS
125.0000 ug | ORAL_TABLET | Freq: Every day | ORAL | Status: DC
  Administered 2018-04-23: 125 ug via ORAL
  Filled 2018-04-22: qty 1

## 2018-04-22 MED ORDER — ONDANSETRON HCL 4 MG PO TABS
4.0000 mg | ORAL_TABLET | Freq: Four times a day (QID) | ORAL | Status: DC | PRN
Start: 2018-04-22 — End: 2018-04-23

## 2018-04-22 MED ORDER — ONDANSETRON HCL 4 MG/2ML IJ SOLN: 4.0000 mg | Freq: Four times a day (QID) | INTRAMUSCULAR | Status: DC | PRN

## 2018-04-22 NOTE — H&P (Signed)
History and Physical    Sara Torres XKG:[id number] DOB: Feb 06, 1976 DOA: 04/22/2018  PCP: Leighton Ruff, MD   Patient coming from: Home   Chief Complaint: Gen weakness, DOE   HPI: Sara Torres is a 42 y.o. female with medical history significant for uterine fibroids status post resection, nephrotic syndrome, hypothyroidism, and hypertension, now presenting to the emergency department with progressive generalized weakness and exertional dyspnea.  Patient reports that she follows closely with OB/GYN, had 17 uterine fibroids resected in 2010, and has since developed recurrence with heavy bleeding.  She reports approximately 1 week of heavy bleeding that began to slow approximately 36 hours ago and has nearly stopped now.  She has developed progressive generalized weakness over the past few days as well as exertional dyspnea without cough, fevers, chills, or chest pain.  She denies melena or hematochezia.  ED Course: Upon arrival to the ED, patient is found to beafebrile, saturating well on room air, and with vitals otherwise normal. Chemistry panel is unremarkable and CB pregnancy test is negative.  Pelvic ultrasound reveals multiple uterine fibroids and complex cyst of the left ovary.  C is notable for microcytic anemia with hemoglobin of 4.3 and MCV of 71.  4 units of packed red blood cells were ordered for immediate transfusion in the ED, patient remained hemodynamically stable and in no apparent respiratory distress.  She will be admitted for ongoing evaluation and management of symptomatic anemia.  Review of Systems:  All other systems reviewed and apart from HPI, are negative.  Past Medical History:  Diagnosis Date  . Allergy   . HPV (human papilloma virus) infection   . HTN (hypertension)   . Hyperthyroidism   . LGSIL (low grade squamous intraepithelial dysplasia)   . Nephrotic syndrome    followed by Dr Jimmy Footman  Prednisone prn  . Nephrotic syndrome     Past Surgical  History:  Procedure Laterality Date  . COLPOSCOPY  2011  . DILITATION & CURRETTAGE/HYSTROSCOPY WITH VERSAPOINT RESECTION N/A 09/11/2013   Procedure: DILATATION & CURETTAGE/HYSTEROSCOPY WITH VERSAPOINT RESECTION;  Surgeon: Alwyn Pea, MD;  Location: [facility];  Service: Gynecology;  Laterality: N/A;  . HERNIA REPAIR  2008 & 2010   with mesh  . MYOMECTOMY  2010   #17     reports that she has never smoked. She has never used smokeless tobacco. She reports that she does not drink alcohol or use drugs.  Allergies  Allergen Reactions  . Amoxicillin Other (See Comments)    Altered mental status & lightheadedness. Has patient had a PCN reaction causing immediate rash, facial/tongue/throat swelling, SOB or lightheadedness with hypotension: Yes Has patient had a PCN reaction causing severe rash involving mucus membranes or skin necrosis: No Has patient had a PCN reaction that required hospitalization: No Has patient had a PCN reaction occurring within the last 10 years: No If all of the above answers are "NO", then may proceed with Cephalosporin use.    Marland Kitchen Hydrocodone Hives, Itching and Rash    Family History  Problem Relation Age of Onset  . Breast cancer Mother   . Pancreatic cancer Paternal Grandmother 71  . Cancer Paternal Aunt        stomach     Prior to Admission medications   Medication Sig Start Date End Date Taking? Authorizing Provider  aspirin (ASPIRIN LOW DOSE) 81 MG chewable tablet Chew 81 mg by mouth daily.   Yes [provider]  Calcium Carb-Cholecalciferol (CALCIUM 600 + D PO)  Take 1 tablet by mouth daily.   Yes [provider]  ferrous sulfate 325 (65 FE) MG tablet Take 325 mg by mouth 2 (two) times daily with a meal.   Yes [provider]  furosemide (LASIX) 20 MG tablet Take 20 mg by mouth daily as needed for edema.   Yes [provider]  labetalol (NORMODYNE) 200 MG tablet Take 200 mg by mouth daily. 03/04/18  Yes [provider]  predniSONE (DELTASONE) 20 MG tablet Take 20 mg by mouth daily as needed (swelling).   Yes [provider]  SYNTHROID 125 MCG tablet Take 125 mcg by mouth daily. 04/08/18  Yes [provider]  traMADol (ULTRAM) 50 MG tablet Take 1 tablet (50 mg total) by mouth every 6 (six) hours as needed. Patient not taking: Reported on 04/22/2018 09/11/13   Delsa Bern, MD    Physical Exam: Vitals:   04/22/18 2122 04/22/18 2124 04/22/18 2130 04/22/18 2142  BP: 106/61 106/61 119/65 114/68  Pulse:  90  92  Resp: (!) 30 (!) 26 (!) 25 (!) 26  Temp:  99.9 F (37.7 C)  99.2 F (37.3 C)  TempSrc:    Oral  SpO2:  100%  100%  Weight:      Height:          Constitutional: NAD, calm  Eyes: PERTLA, lids and conjunctivae normal ENMT: Mucous membranes are moist. Posterior pharynx clear of any exudate or lesions.   Neck: normal, supple, no masses, no thyromegaly Respiratory: clear to auscultation bilaterally, no wheezing, no crackles. Normal respiratory effort.    Cardiovascular: S1 & S2 heard, regular rate and rhythm. No extremity edema.  Abdomen: No distension, no tenderness, soft. Bowel sounds normal.  Musculoskeletal: no clubbing / cyanosis. No joint deformity upper and lower extremities.   Skin: no significant rashes, lesions, ulcers. Warm, dry, well-perfused. Neurologic: CN 2-12 grossly intact. Sensation intact. Strength 5/5 in all 4 limbs.  Psychiatric:  Alert and oriented x 3. Pleasant and cooperative.     Labs on Admission: I have personally reviewed following labs and imaging studies  CBC: Recent Labs  Lab 04/22/18 1810  WBC 4.3  HGB 4.3*  HCT 17.4*  MCV 71.0*  PLT 614   Basic Metabolic Panel: Recent Labs  Lab 04/22/18 1810  NA 138  K 4.0  CL 104  CO2 26  GLUCOSE 100*  BUN 8  CREATININE 0.93  CALCIUM 8.9   GFR: Estimated Creatinine Clearance: 105.4 mL/min (by C-G formula based on SCr of 0.93 mg/dL). Liver Function Tests: Recent Labs    Lab 04/22/18 1810  AST 13*  ALT 10  ALKPHOS 61  BILITOT 1.2  PROT 6.9  ALBUMIN 3.4*   No results for input(s): LIPASE, AMYLASE in the last 168 hours. No results for input(s): AMMONIA in the last 168 hours. Coagulation Profile: No results for input(s): INR, PROTIME in the last 168 hours. Cardiac Enzymes: No results for input(s): CKTOTAL, CKMB, CKMBINDEX, TROPONINI in the last 168 hours. BNP (last 3 results) No results for input(s): PROBNP in the last 8760 hours. HbA1C: No results for input(s): HGBA1C in the last 72 hours. CBG: No results for input(s): GLUCAP in the last 168 hours. Lipid Profile: No results for input(s): CHOL, HDL, LDLCALC, TRIG, CHOLHDL, LDLDIRECT in the last 72 hours. Thyroid Function Tests: No results for input(s): TSH, T4TOTAL, FREET4, T3FREE, THYROIDAB in the last 72 hours. Anemia Panel: Recent Labs    04/22/18 2102  RETICCTPCT 6.6*  Urine analysis: No results found for: COLORURINE, APPEARANCEUR, LABSPEC, PHURINE, GLUCOSEU, HGBUR, BILIRUBINUR, KETONESUR, PROTEINUR, UROBILINOGEN, NITRITE, LEUKOCYTESUR Sepsis Labs: @LABRCNTIP (procalcitonin:4,lacticidven:4) )No results found for this or any previous visit (from the past 240 hour(s)).   Radiological Exams on Admission: US Transvaginal Non-ob  Result Date: 04/22/2018 CLINICAL DATA:  42 y/o F; heavy vaginal bleeding for 6 days. History of myomectomy in 2010. EXAM: TRANSABDOMINAL AND TRANSVAGINAL ULTRASOUND OF PELVIS TECHNIQUE: Both transabdominal and transvaginal ultrasound examinations of the pelvis were performed. Transabdominal technique was performed for global imaging of the pelvis including uterus, ovaries, adnexal regions, and pelvic cul-de-sac. It was necessary to proceed with endovaginal exam following the transabdominal exam to visualize the endometrium and ovaries. COMPARISON:  02/11/2009 MRI of the pelvis. FINDINGS: Uterus Measurements: 13.5 x 8.8 x 9.1 cm. Posterior lower uterine segment partially  submucosal myoma measuring up to 5.0 cm. Posterior fundal subserosal myoma measuring up to 3.5 cm. Anterior fundal subserosal myoma measuring up to 4.3 cm. Endometrium Thickness: 13 mm.  No focal abnormality visualized. Right ovary Not visualized. Left ovary Measurements: 6.4 x 3.7 x 5.4 cm. Avascular cysts with reticular internal echoes measuring up to 4.5 cm is decreased in size from 2010, possibly endometrioma or hemorrhagic cyst. Other findings No abnormal free fluid. IMPRESSION: 1. Multiple uterine fibroids measuring up to 5 cm. The lower uterine segment myoma is partially submucosal. 2. Left ovary complex cyst, probably endometrioma or hemorrhagic cyst. A similar sized cystic structure was present on the 2010 pelvic MRI. 6-12 week follow-up with pelvic ultrasound is recommended. Electronically Signed   By: Kristine Garbe M.D.   On: 04/22/2018 20:03   US Pelvis Complete  Result Date: 04/22/2018 CLINICAL DATA:  42 y/o F; heavy vaginal bleeding for 6 days. History of myomectomy in 2010. EXAM: TRANSABDOMINAL AND TRANSVAGINAL ULTRASOUND OF PELVIS TECHNIQUE: Both transabdominal and transvaginal ultrasound examinations of the pelvis were performed. Transabdominal technique was performed for global imaging of the pelvis including uterus, ovaries, adnexal regions, and pelvic cul-de-sac. It was necessary to proceed with endovaginal exam following the transabdominal exam to visualize the endometrium and ovaries. COMPARISON:  02/11/2009 MRI of the pelvis. FINDINGS: Uterus Measurements: 13.5 x 8.8 x 9.1 cm. Posterior lower uterine segment partially submucosal myoma measuring up to 5.0 cm. Posterior fundal subserosal myoma measuring up to 3.5 cm. Anterior fundal subserosal myoma measuring up to 4.3 cm. Endometrium Thickness: 13 mm.  No focal abnormality visualized. Right ovary Not visualized. Left ovary Measurements: 6.4 x 3.7 x 5.4 cm. Avascular cysts with reticular internal echoes measuring up to 4.5 cm is  decreased in size from 2010, possibly endometrioma or hemorrhagic cyst. Other findings No abnormal free fluid. IMPRESSION: 1. Multiple uterine fibroids measuring up to 5 cm. The lower uterine segment myoma is partially submucosal. 2. Left ovary complex cyst, probably endometrioma or hemorrhagic cyst. A similar sized cystic structure was present on the 2010 pelvic MRI. 6-12 week follow-up with pelvic ultrasound is recommended. Electronically Signed   By: Kristine Garbe M.D.   On: 04/22/2018 20:03    EKG: Not performed.   Assessment/Plan   1. Symptomatic anemia; dysfunctional uterine bleeding  - Pt has hx of DUB attributed to uterine fibroids and follows with OB/GYN for this, now p/w gen weakness and DOE after a week of heavy bleeding  - Bleeding has slowed significantly over the past 36 hours  - Hgb is 4.3 in ED and 4 units pRBC's were ordered for transfusion  - Check post-transfusion CBC    2.  Hypertension  - BP at goal  - Continue labetalol as tolerated    3. Hypothyroidism  - Continue Synthroid    4. Nephrotic syndrome  - Followed by nephrology and managed with prednisone prn but has been quiescent recently and there is no edema or renal insufficiency on admission   5. Complex cyst of left ovary  - Follow-up pelvic ultrasound recommended in 6-12 weeks    DVT prophylaxis: SCD's  Code Status: Full  Family Communication: Discussed with patient  Consults called: None Admission status: Observation     Vianne Bulls, MD [facility] Pager [phone]  If 7PM-7AM, please contact night-coverage [email or web address] Password [email or web address]  04/22/2018, 10:00 PM

## 2018-04-22 NOTE — ED Triage Notes (Signed)
Pt states she has hx of uterine fibroids. Pt has been having vaginal bleeding/clots for approx week. Pt feels very weak.

## 2018-04-22 NOTE — ED Provider Notes (Signed)
[facility] EMERGENCY DEPARTMENT Provider Note   CSN: [id number] Arrival date & time: 04/22/18  1728     History   Chief Complaint Chief Complaint  Patient presents with  . Vaginal Bleeding    HPI Sara Torres is a 42 y.o. female.  HPI Patient is a 42 year old female with a history of abnormal uterine bleeding and known uterine fibroids.  She is had some uterine fibroid surgery before in the past but has had recurrent uterine fibroids.  She presents now to the emergency department with weakness and exertional shortness of breath over the past several days.  She was having heavy vaginal bleeding through all of last week and through the weekend but since her vaginal bleeding has significantly improved.  No significant vaginal bleeding today at all.  No passage of clots today.  Feels weak and tired.  Found to have a hemoglobin of 4.4 on arrival to the emergency department prior to my evaluation.  Patient has never required blood transfusion before in the past.  She is on iron supplementation.   Past Medical History:  Diagnosis Date  . Allergy   . HPV (human papilloma virus) infection   . HTN (hypertension)   . Hyperthyroidism   . LGSIL (low grade squamous intraepithelial dysplasia)   . Nephrotic syndrome    followed by Dr Jimmy Footman  Prednisone prn  . Nephrotic syndrome     Patient Active Problem List   Diagnosis Date Noted  . Nephrotic syndrome   . LGSIL (low grade squamous intraepithelial dysplasia)   . HPV (human papilloma virus) infection   . HTN (hypertension)   . Nephrotic syndrome     Past Surgical History:  Procedure Laterality Date  . COLPOSCOPY  2011  . DILITATION & CURRETTAGE/HYSTROSCOPY WITH VERSAPOINT RESECTION N/A 09/11/2013   Procedure: DILATATION & CURETTAGE/HYSTEROSCOPY WITH VERSAPOINT RESECTION;  Surgeon: Alwyn Pea, MD;  Location: [facility];  Service: Gynecology;  Laterality: N/A;  . HERNIA REPAIR  2008 & 2010   with mesh  .  MYOMECTOMY  2010   #17     OB History    Gravida  2   Para  0   Term      Preterm      AB      Living        SAB      TAB      Ectopic      Multiple      Live Births               Home Medications    Prior to Admission medications   Medication Sig Start Date End Date Taking? Authorizing Provider  aspirin (ASPIRIN LOW DOSE) 81 MG chewable tablet Chew 81 mg by mouth daily.   Yes [provider]  Calcium Carb-Cholecalciferol (CALCIUM 600 + D PO) Take 1 tablet by mouth daily.   Yes [provider]  ferrous sulfate 325 (65 FE) MG tablet Take 325 mg by mouth 2 (two) times daily with a meal.   Yes [provider]  furosemide (LASIX) 20 MG tablet Take 20 mg by mouth daily as needed for edema.   Yes [provider]  labetalol (NORMODYNE) 200 MG tablet Take 200 mg by mouth daily. 03/04/18  Yes [provider]  predniSONE (DELTASONE) 20 MG tablet Take 20 mg by mouth daily as needed (swelling).   Yes [provider]  SYNTHROID 125 MCG tablet Take 125 mcg by mouth daily.  04/08/18  Yes [provider]  traMADol (ULTRAM) 50 MG tablet Take 1 tablet (50 mg total) by mouth every 6 (six) hours as needed. Patient not taking: Reported on 04/22/2018 09/11/13   Delsa Bern, MD    Family History Family History  Problem Relation Age of Onset  . Breast cancer Mother   . Pancreatic cancer Paternal Grandmother 19  . Cancer Paternal Aunt        stomach    Social History Social History   Tobacco Use  . Smoking status: Never Smoker  . Smokeless tobacco: Never Used  Substance Use Topics  . Alcohol use: No  . Drug use: No     Allergies   Amoxicillin and Hydrocodone   Review of Systems Review of Systems  All other systems reviewed and are negative.    Physical Exam Updated Vital Signs BP 114/68   Pulse 92   Temp 99.2 F (37.3 C) (Oral)   Resp (!) 26   Ht 5\' 9"  (1.753 m)   Wt 114.8 kg (253 lb)   LMP  04/09/2018   SpO2 100%   BMI 37.36 kg/m   Physical Exam  Constitutional: She is oriented to person, place, and time. She appears well-developed and well-nourished. No distress.  HENT:  Head: Normocephalic and atraumatic.  Eyes: EOM are normal.  Neck: Normal range of motion.  Cardiovascular: Normal rate, regular rhythm and normal heart sounds.  Pulmonary/Chest: Effort normal and breath sounds normal.  Abdominal: Soft. She exhibits no distension. There is no tenderness.  Musculoskeletal: Normal range of motion.  Neurological: She is alert and oriented to person, place, and time.  Skin: Skin is warm and dry.  Psychiatric: She has a normal mood and affect. Judgment normal.  Nursing note and vitals reviewed.    ED Treatments / Results  Labs (all labs ordered are listed, but only abnormal results are displayed) Labs Reviewed  COMPREHENSIVE METABOLIC PANEL - Abnormal; Notable for the following components:      Result Value   Glucose, Bld 100 (*)    Albumin 3.4 (*)    AST 13 (*)    All other components within normal limits  CBC - Abnormal; Notable for the following components:   RBC 2.45 (*)    Hemoglobin 4.3 (*)    HCT 17.4 (*)    MCV 71.0 (*)    MCH 17.6 (*)    MCHC 24.7 (*)    RDW 24.0 (*)    All other components within normal limits  VITAMIN B12  FOLATE  IRON AND TIBC  FERRITIN  RETICULOCYTES  I-STAT BETA HCG BLOOD, ED ([facility], [facility], [facility] ONLY)  TYPE AND SCREEN  ABO/RH  PREPARE RBC (CROSSMATCH)    EKG None  Radiology US Transvaginal Non-ob  Result Date: 04/22/2018 CLINICAL DATA:  42 y/o F; heavy vaginal bleeding for 6 days. History of myomectomy in 2010. EXAM: TRANSABDOMINAL AND TRANSVAGINAL ULTRASOUND OF PELVIS TECHNIQUE: Both transabdominal and transvaginal ultrasound examinations of the pelvis were performed. Transabdominal technique was performed for global imaging of the pelvis including uterus, ovaries, adnexal regions, and pelvic cul-de-sac. It was necessary to  proceed with endovaginal exam following the transabdominal exam to visualize the endometrium and ovaries. COMPARISON:  02/11/2009 MRI of the pelvis. FINDINGS: Uterus Measurements: 13.5 x 8.8 x 9.1 cm. Posterior lower uterine segment partially submucosal myoma measuring up to 5.0 cm. Posterior fundal subserosal myoma measuring up to 3.5 cm. Anterior fundal subserosal myoma measuring up to 4.3 cm. Endometrium Thickness: 13  mm.  No focal abnormality visualized. Right ovary Not visualized. Left ovary Measurements: 6.4 x 3.7 x 5.4 cm. Avascular cysts with reticular internal echoes measuring up to 4.5 cm is decreased in size from 2010, possibly endometrioma or hemorrhagic cyst. Other findings No abnormal free fluid. IMPRESSION: 1. Multiple uterine fibroids measuring up to 5 cm. The lower uterine segment myoma is partially submucosal. 2. Left ovary complex cyst, probably endometrioma or hemorrhagic cyst. A similar sized cystic structure was present on the 2010 pelvic MRI. 6-12 week follow-up with pelvic ultrasound is recommended. Electronically Signed   By: Kristine Garbe M.D.   On: 04/22/2018 20:03   US Pelvis Complete  Result Date: 04/22/2018 CLINICAL DATA:  42 y/o F; heavy vaginal bleeding for 6 days. History of myomectomy in 2010. EXAM: TRANSABDOMINAL AND TRANSVAGINAL ULTRASOUND OF PELVIS TECHNIQUE: Both transabdominal and transvaginal ultrasound examinations of the pelvis were performed. Transabdominal technique was performed for global imaging of the pelvis including uterus, ovaries, adnexal regions, and pelvic cul-de-sac. It was necessary to proceed with endovaginal exam following the transabdominal exam to visualize the endometrium and ovaries. COMPARISON:  02/11/2009 MRI of the pelvis. FINDINGS: Uterus Measurements: 13.5 x 8.8 x 9.1 cm. Posterior lower uterine segment partially submucosal myoma measuring up to 5.0 cm. Posterior fundal subserosal myoma measuring up to 3.5 cm. Anterior fundal  subserosal myoma measuring up to 4.3 cm. Endometrium Thickness: 13 mm.  No focal abnormality visualized. Right ovary Not visualized. Left ovary Measurements: 6.4 x 3.7 x 5.4 cm. Avascular cysts with reticular internal echoes measuring up to 4.5 cm is decreased in size from 2010, possibly endometrioma or hemorrhagic cyst. Other findings No abnormal free fluid. IMPRESSION: 1. Multiple uterine fibroids measuring up to 5 cm. The lower uterine segment myoma is partially submucosal. 2. Left ovary complex cyst, probably endometrioma or hemorrhagic cyst. A similar sized cystic structure was present on the 2010 pelvic MRI. 6-12 week follow-up with pelvic ultrasound is recommended. Electronically Signed   By: Kristine Garbe M.D.   On: 04/22/2018 20:03    Procedures .Critical Care Performed by: Jola Schmidt, MD Authorized by: Jola Schmidt, MD     CRITICAL CARE Performed by: Jola Schmidt Total critical care time: 32 minutes Critical care time was exclusive of separately billable procedures and treating other patients. Critical care was necessary to treat or prevent imminent or life-threatening deterioration. Critical care was time spent personally by me on the following activities: development of treatment plan with patient and/or surrogate as well as nursing, discussions with consultants, evaluation of patient's response to treatment, examination of patient, obtaining history from patient or surrogate, ordering and performing treatments and interventions, ordering and review of laboratory studies, ordering and review of radiographic studies, pulse oximetry and re-evaluation of patient's condition.   Medications Ordered in ED Medications  0.9 %  sodium chloride infusion (10 mL/hr Intravenous New Bag/Given 04/22/18 2130)     Initial Impression / Assessment and Plan / ED Course  I have reviewed the triage vital signs and the nursing notes.  Pertinent labs & imaging results that were available  during my care of the patient were reviewed by me and considered in my medical decision making (see chart for details).     Patient will be transfused 4 units of blood for symptomatic anemia from uterine fibroid bleeding.  Her uterine bleeding has decreased and therefore I do not think she needs emergent gynecology evaluation at this time.  Blood transfusion outpatient guide follow-up.  Final Clinical Impressions(s) /  ED Diagnoses   Final diagnoses:  Symptomatic anemia  DUB (dysfunctional uterine bleeding)  Uterine leiomyoma, unspecified location    ED Discharge Orders    None       Jola Schmidt, MD 04/22/18 2201

## 2018-04-22 NOTE — ED Provider Notes (Signed)
Patient placed in Quick Look pathway, seen and evaluated   Chief Complaint: vaginal bleeding, weakness  HPI:   Pt states she had very heavy vaginal bleeding on wed. Passing heavy clots with associated suprapubic pain. On Thursday she had persistent vomiting. Heavy bleeding continued Friday - Sunday. Pt has been trying to get pregnant, no positive home pregnancy. She had clear discharge before this began., had a period 2 wks ago. She denies fevers, chills, or abnormal BMs. Since this began, she has had a lot of weakness, states she has difficulty moving around due to weakness. Has needed blood transfusions and iron in the past for anemia.   ROS: weakness, vaginal discharge  Physical Exam:   Gen: No distress  Neuro: Awake and Alert  Skin: Warm, pale/dry MM    Focused Exam: no ttp of abd   Initiation of care has begun. The patient has been counseled on the process, plan, and necessity for staying for the completion/evaluation, and the remainder of the medical screening examination    Franchot Heidelberg, PA-C 04/22/18 Alease Medina, MD 04/22/18 2157

## 2018-04-23 ENCOUNTER — Other Ambulatory Visit: Payer: Self-pay

## 2018-04-23 DIAGNOSIS — D649 Anemia, unspecified: Secondary | ICD-10-CM | POA: Diagnosis not present

## 2018-04-23 LAB — CBC
HCT: 28 % — ABNORMAL LOW (ref 36.0–46.0)
HEMOGLOBIN: 8.3 g/dL — AB (ref 12.0–15.0)
MCH: 21.8 pg — ABNORMAL LOW (ref 26.0–34.0)
MCHC: 29.6 g/dL — ABNORMAL LOW (ref 30.0–36.0)
MCV: 73.5 fL — ABNORMAL LOW (ref 78.0–100.0)
Platelets: 215 10*3/uL (ref 150–400)
RBC: 3.81 MIL/uL — AB (ref 3.87–5.11)
RDW: 23.8 % — ABNORMAL HIGH (ref 11.5–15.5)
WBC: 7 10*3/uL (ref 4.0–10.5)

## 2018-04-23 LAB — BASIC METABOLIC PANEL
ANION GAP: 10 (ref 5–15)
BUN: 7 mg/dL (ref 6–20)
CO2: 24 mmol/L (ref 22–32)
Calcium: 8.9 mg/dL (ref 8.9–10.3)
Chloride: 104 mmol/L (ref 98–111)
Creatinine, Ser: 0.95 mg/dL (ref 0.44–1.00)
GFR calc non Af Amer: >60 mL/min (ref >60)
Glucose, Bld: 132 mg/dL — ABNORMAL HIGH (ref 70–99)
Potassium: 3.6 mmol/L (ref 3.5–5.1)
Sodium: 138 mmol/L (ref 135–145)

## 2018-04-23 LAB — HIV ANTIBODY (ROUTINE TESTING W REFLEX): HIV Screen 4th Generation wRfx: NONREACTIVE

## 2018-04-23 MED ORDER — FERUMOXYTOL INJECTION 510 MG/17 ML
510.0000 mg | Freq: Once | INTRAVENOUS | Status: AC
  Administered 2018-04-23: 510 mg via INTRAVENOUS
  Filled 2018-04-23: qty 17

## 2018-04-23 NOTE — Care Management Note (Signed)
Case Management Note  Patient Details  Name: Sara Torres MRN: [id number] Date of Birth: 1975/11/07  Subjective/Objective:     Pt admitted with symptomatic anemia. She is from home with spouse.     PcP: Dr  Drema Dallas Insurance: Lorella Nimrod        Action/Plan: Pt discharging home with self care. Pt has transportation home.   Expected Discharge Date:  04/23/18               Expected Discharge Plan:  Home/Self Care  In-House Referral:     Discharge planning Services     Post Acute Care Choice:    Choice offered to:     DME Arranged:    DME Agency:     HH Arranged:    HH Agency:     Status of Service:  Completed, signed off  If discussed at [employer] of Stay Meetings, dates discussed:    Additional Comments:  Pollie Friar, RN 04/23/2018, 11:56 AM

## 2018-04-23 NOTE — Progress Notes (Signed)
Pt being discharged from hospital per orders from MD. Pt educated on discharge instructions. Pt verbalized understanding of instructions. All questions and concerns were addressed. Pt's IV was removed prior to discharge. Pt exited hospital via wheelchair accompanied by staff. 

## 2018-04-23 NOTE — Discharge Summary (Signed)
Physician Discharge Summary  MYKAEL TROTT TMH:[id number] DOB: 01-Sep-1976 DOA: 04/22/2018  PCP: Leighton Ruff, MD  Admit date: 04/22/2018 Discharge date: 04/23/2018  Admitted From: Home Disposition: Home  Recommendations for Outpatient Follow-up:  1. Follow up with PCP in 1-2 weeks 2. Please obtain BMP/CBC in one week 3 Follow-up with GYN   Home Health: None  Equipment/Devices: None  Discharge Condition: Stable CODE STATUS: Full code Diet recommendation: Cardiac diet Brief/Interim Summary:42 y.o. female with medical history significant for uterine fibroids status post resection, nephrotic syndrome, hypothyroidism, and hypertension, now presenting to the emergency department with progressive generalized weakness and exertional dyspnea.  Patient reports that she follows closely with OB/GYN, had 17 uterine fibroids resected in 2010, and has since developed recurrence with heavy bleeding.  She reports approximately 1 week of heavy bleeding that began to slow approximately 36 hours ago and has nearly stopped now.  She has developed progressive generalized weakness over the past few days as well as exertional dyspnea without cough, fevers, chills, or chest pain.  She denies melena or hematochezia.  ED Course: Upon arrival to the ED, patient is found to beafebrile, saturating well on room air, and with vitals otherwise normal. Chemistry panel is unremarkable and CB pregnancy test is negative.  Pelvic ultrasound reveals multiple uterine fibroids and complex cyst of the left ovary.  C is notable for microcytic anemia with hemoglobin of 4.3 and MCV of 71.  4 units of packed red blood cells were ordered for immediate transfusion in the ED, patient remained hemodynamically stable and in no apparent respiratory distress.  She will be admitted for ongoing evaluation and management of symptomatic anemia.    Discharge Diagnoses:  Principal Problem:   Symptomatic anemia Active Problems:   HTN  (hypertension)   Nephrotic syndrome   Complex cyst of left ovary  1] dysfunctional uterine bleeding secondary to uterine fibroids status post 4 units of blood transfusion.  Hemoglobin 4.3 on admission.  She received 4 units of blood transfusion and IV Feraheme.  Patient is awake alert weakness is better husband by the bedside ambulated in the room eager to go home.  2. Hypertension  - BP at goal  - Continue labetalol   3. Hypothyroidism  - Continue Synthroid    4. Nephrotic syndrome  - Followed by nephrology and managed with prednisone prn but has been quiescent recently and there is no edema or renal insufficiency on admission   5. Complex cyst of left ovary  - Follow-up pelvic ultrasound recommended in 6-12 weeks      Discharge Instructions  Discharge Instructions    Call MD for:  difficulty breathing, headache or visual disturbances   Complete by:  As directed    Call MD for:  extreme fatigue   Complete by:  As directed    Call MD for:  persistant dizziness or light-headedness   Complete by:  As directed    Call MD for:  persistant nausea and vomiting   Complete by:  As directed    Call MD for:  severe uncontrolled pain   Complete by:  As directed    Call MD for:  temperature >100.4   Complete by:  As directed    Diet - low sodium heart healthy   Complete by:  As directed    Increase activity slowly   Complete by:  As directed      Allergies as of 04/23/2018      Reactions   Amoxicillin Other (See Comments)  Altered mental status & lightheadedness. Has patient had a PCN reaction causing immediate rash, facial/tongue/throat swelling, SOB or lightheadedness with hypotension: Yes Has patient had a PCN reaction causing severe rash involving mucus membranes or skin necrosis: No Has patient had a PCN reaction that required hospitalization: No Has patient had a PCN reaction occurring within the last 10 years: No If all of the above answers are "NO", then may proceed  with Cephalosporin use.   Hydrocodone Hives, Itching, Rash      Medication List    STOP taking these medications   ASPIRIN LOW DOSE 81 MG chewable tablet Generic drug:  aspirin   traMADol 50 MG tablet Commonly known as:  ULTRAM     TAKE these medications   CALCIUM 600 + D PO Take 1 tablet by mouth daily.   ferrous sulfate 325 (65 FE) MG tablet Take 325 mg by mouth 2 (two) times daily with a meal.   furosemide 20 MG tablet Commonly known as:  LASIX Take 20 mg by mouth daily as needed for edema.   labetalol 200 MG tablet Commonly known as:  NORMODYNE Take 200 mg by mouth daily.   predniSONE 20 MG tablet Commonly known as:  DELTASONE Take 20 mg by mouth daily as needed (swelling).   SYNTHROID 125 MCG tablet Generic drug:  levothyroxine Take 125 mcg by mouth daily.      Follow-up Information    Leighton Ruff, MD.   Specialty:  [facility] Contact information: [place] [address] [phone]          Allergies  Allergen Reactions  . Amoxicillin Other (See Comments)    Altered mental status & lightheadedness. Has patient had a PCN reaction causing immediate rash, facial/tongue/throat swelling, SOB or lightheadedness with hypotension: Yes Has patient had a PCN reaction causing severe rash involving mucus membranes or skin necrosis: No Has patient had a PCN reaction that required hospitalization: No Has patient had a PCN reaction occurring within the last 10 years: No If all of the above answers are "NO", then may proceed with Cephalosporin use.    Marland Kitchen Hydrocodone Hives, Itching and Rash    Consultations:   Procedures/Studies: US Transvaginal Non-ob  Result Date: 04/22/2018 CLINICAL DATA:  42 y/o F; heavy vaginal bleeding for 6 days. History of myomectomy in 2010. EXAM: TRANSABDOMINAL AND TRANSVAGINAL ULTRASOUND OF PELVIS TECHNIQUE: Both transabdominal and transvaginal ultrasound examinations of the pelvis were performed.  Transabdominal technique was performed for global imaging of the pelvis including uterus, ovaries, adnexal regions, and pelvic cul-de-sac. It was necessary to proceed with endovaginal exam following the transabdominal exam to visualize the endometrium and ovaries. COMPARISON:  02/11/2009 MRI of the pelvis. FINDINGS: Uterus Measurements: 13.5 x 8.8 x 9.1 cm. Posterior lower uterine segment partially submucosal myoma measuring up to 5.0 cm. Posterior fundal subserosal myoma measuring up to 3.5 cm. Anterior fundal subserosal myoma measuring up to 4.3 cm. Endometrium Thickness: 13 mm.  No focal abnormality visualized. Right ovary Not visualized. Left ovary Measurements: 6.4 x 3.7 x 5.4 cm. Avascular cysts with reticular internal echoes measuring up to 4.5 cm is decreased in size from 2010, possibly endometrioma or hemorrhagic cyst. Other findings No abnormal free fluid. IMPRESSION: 1. Multiple uterine fibroids measuring up to 5 cm. The lower uterine segment myoma is partially submucosal. 2. Left ovary complex cyst, probably endometrioma or hemorrhagic cyst. A similar sized cystic structure was present on the 2010 pelvic MRI. 6-12 week follow-up with pelvic ultrasound is  recommended. Electronically Signed   By: Kristine Garbe M.D.   On: 04/22/2018 20:03   US Pelvis Complete  Result Date: 04/22/2018 CLINICAL DATA:  42 y/o F; heavy vaginal bleeding for 6 days. History of myomectomy in 2010. EXAM: TRANSABDOMINAL AND TRANSVAGINAL ULTRASOUND OF PELVIS TECHNIQUE: Both transabdominal and transvaginal ultrasound examinations of the pelvis were performed. Transabdominal technique was performed for global imaging of the pelvis including uterus, ovaries, adnexal regions, and pelvic cul-de-sac. It was necessary to proceed with endovaginal exam following the transabdominal exam to visualize the endometrium and ovaries. COMPARISON:  02/11/2009 MRI of the pelvis. FINDINGS: Uterus Measurements: 13.5 x 8.8 x 9.1 cm.  Posterior lower uterine segment partially submucosal myoma measuring up to 5.0 cm. Posterior fundal subserosal myoma measuring up to 3.5 cm. Anterior fundal subserosal myoma measuring up to 4.3 cm. Endometrium Thickness: 13 mm.  No focal abnormality visualized. Right ovary Not visualized. Left ovary Measurements: 6.4 x 3.7 x 5.4 cm. Avascular cysts with reticular internal echoes measuring up to 4.5 cm is decreased in size from 2010, possibly endometrioma or hemorrhagic cyst. Other findings No abnormal free fluid. IMPRESSION: 1. Multiple uterine fibroids measuring up to 5 cm. The lower uterine segment myoma is partially submucosal. 2. Left ovary complex cyst, probably endometrioma or hemorrhagic cyst. A similar sized cystic structure was present on the 2010 pelvic MRI. 6-12 week follow-up with pelvic ultrasound is recommended. Electronically Signed   By: Kristine Garbe M.D.   On: 04/22/2018 20:03    (Echo, Carotid, EGD, Colonoscopy, ERCP)    Subjective: Awake alert denies any chest pain shortness of breath or weakness feels well no further bleeding   Discharge Exam: Vitals:   04/23/18 0658 04/23/18 0752  BP: 128/66 (!) 116/58  Pulse: 82 76  Resp: 18 17  Temp: 98.4 F (36.9 C) (!) 97.5 F (36.4 C)  SpO2: 100% 100%   Vitals:   04/23/18 0522 04/23/18 0640 04/23/18 0658 04/23/18 0752  BP: 113/61 130/66 128/66 (!) 116/58  Pulse: 78 82 82 76  Resp: 18 18 18 17   Temp: 98.4 F (36.9 C) 98.4 F (36.9 C) 98.4 F (36.9 C) (!) 97.5 F (36.4 C)  TempSrc:    Oral  SpO2: 100% 100% 100% 100%  Weight:      Height:        General: Pt is alert, awake, not in acute distress Cardiovascular: RRR, S1/S2 +, no rubs, no gallops Respiratory: CTA bilaterally, no wheezing, no rhonchi Abdominal: Soft, NT, ND, bowel sounds + Extremities: no edema, no cyanosis    The results of significant diagnostics from this hospitalization (including imaging, microbiology, ancillary and laboratory) are  listed below for reference.     Microbiology: No results found for this or any previous visit (from the past 240 hour(s)).   Labs: BNP (last 3 results) No results for input(s): BNP in the last 8760 hours. Basic Metabolic Panel: Recent Labs  Lab 04/22/18 1810 04/23/18 0811  NA 138 138  K 4.0 3.6  CL 104 104  CO2 26 24  GLUCOSE 100* 132*  BUN 8 7  CREATININE 0.93 0.95  CALCIUM 8.9 8.9   Liver Function Tests: Recent Labs  Lab 04/22/18 1810  AST 13*  ALT 10  ALKPHOS 61  BILITOT 1.2  PROT 6.9  ALBUMIN 3.4*   No results for input(s): LIPASE, AMYLASE in the last 168 hours. No results for input(s): AMMONIA in the last 168 hours. CBC: Recent Labs  Lab 04/22/18 1810 04/23/18 4696  WBC 4.3 7.0  HGB 4.3* 8.3*  HCT 17.4* 28.0*  MCV 71.0* 73.5*  PLT 292 215   Cardiac Enzymes: No results for input(s): CKTOTAL, CKMB, CKMBINDEX, TROPONINI in the last 168 hours. BNP: Invalid input(s): POCBNP CBG: No results for input(s): GLUCAP in the last 168 hours. D-Dimer No results for input(s): DDIMER in the last 72 hours. Hgb A1c No results for input(s): HGBA1C in the last 72 hours. Lipid Profile No results for input(s): CHOL, HDL, LDLCALC, TRIG, CHOLHDL, LDLDIRECT in the last 72 hours. Thyroid function studies No results for input(s): TSH, T4TOTAL, T3FREE, THYROIDAB in the last 72 hours.  Invalid input(s): FREET3 Anemia work up [employer]    04/22/18 2102  VITAMINB12 160*  FOLATE 15.2  FERRITIN 8*  TIBC 356  IRON 17*  RETICCTPCT 6.6*   Urinalysis No results found for: COLORURINE, APPEARANCEUR, LABSPEC, PHURINE, GLUCOSEU, HGBUR, BILIRUBINUR, KETONESUR, PROTEINUR, UROBILINOGEN, NITRITE, LEUKOCYTESUR Sepsis Labs Invalid input(s): PROCALCITONIN,  WBC,  LACTICIDVEN Microbiology No results found for this or any previous visit (from the past 240 hour(s)).   Time coordinating discharge: 35 minutes  SIGNED:   Georgette Shell, MD  [facility] 04/23/2018,  11:58 AM Pager   If 7PM-7AM, please contact night-coverage [email or web address] Password [email or web address]

## 2018-04-24 LAB — BPAM RBC
Blood Product Expiration Date: 201908062359
Blood Product Expiration Date: 201908062359
Blood Product Expiration Date: 201908232359
Blood Product Expiration Date: 201908252359
ISSUE DATE / TIME: 201907302110
ISSUE DATE / TIME: 201907302341
ISSUE DATE / TIME: 201907310152
ISSUE DATE / TIME: 201907310429
UNIT TYPE AND RH: 7300
Unit Type and Rh: 7300
Unit Type and Rh: 7300
Unit Type and Rh: 7300

## 2018-04-24 LAB — TYPE AND SCREEN
ABO/RH(D): B POS
Antibody Screen: NEGATIVE
UNIT DIVISION: 0
UNIT DIVISION: 0
Unit division: 0
Unit division: 0

## 2018-07-22 ENCOUNTER — Other Ambulatory Visit (HOSPITAL_COMMUNITY): Payer: Self-pay | Admitting: *Deleted

## 2018-07-23 ENCOUNTER — Ambulatory Visit (HOSPITAL_COMMUNITY)
Admission: RE | Admit: 2018-07-23 | Discharge: 2018-07-23 | Disposition: A | Discharge status: Home / Self Care | Payer: Federal, State, Local not specified - PPO | Source: Ambulatory Visit | Attending: Nephrology | Admitting: Nephrology

## 2018-07-23 DIAGNOSIS — N189 Chronic kidney disease, unspecified: Secondary | ICD-10-CM | POA: Diagnosis present

## 2018-07-23 DIAGNOSIS — D631 Anemia in chronic kidney disease: Secondary | ICD-10-CM | POA: Insufficient documentation

## 2018-07-23 MED ORDER — SODIUM CHLORIDE 0.9 % IV SOLN
510.0000 mg | INTRAVENOUS | Status: DC
  Administered 2018-07-23: 510 mg via INTRAVENOUS
  Filled 2018-07-23: qty 17

## 2018-07-30 ENCOUNTER — Ambulatory Visit (HOSPITAL_COMMUNITY)
Admission: RE | Admit: 2018-07-30 | Discharge: 2018-07-30 | Disposition: A | Discharge status: Home / Self Care | Payer: Federal, State, Local not specified - PPO | Source: Ambulatory Visit | Attending: Nephrology | Admitting: Nephrology

## 2018-07-30 DIAGNOSIS — D631 Anemia in chronic kidney disease: Secondary | ICD-10-CM | POA: Diagnosis present

## 2018-07-30 DIAGNOSIS — N189 Chronic kidney disease, unspecified: Secondary | ICD-10-CM | POA: Diagnosis present

## 2018-07-30 MED ORDER — SODIUM CHLORIDE 0.9 % IV SOLN
510.0000 mg | INTRAVENOUS | Status: DC
  Administered 2018-07-30: 510 mg via INTRAVENOUS
  Filled 2018-07-30: qty 17

## 2018-07-31 ENCOUNTER — Other Ambulatory Visit: Payer: Self-pay | Admitting: Obstetrics and Gynecology

## 2018-07-31 DIAGNOSIS — Z1231 Encounter for screening mammogram for malignant neoplasm of breast: Secondary | ICD-10-CM

## 2018-09-12 ENCOUNTER — Ambulatory Visit
Admission: RE | Admit: 2018-09-12 | Discharge: 2018-09-12 | Disposition: A | Discharge status: Home / Self Care | Payer: Federal, State, Local not specified - PPO | Source: Ambulatory Visit | Attending: Obstetrics and Gynecology | Admitting: Obstetrics and Gynecology

## 2018-09-12 DIAGNOSIS — Z1231 Encounter for screening mammogram for malignant neoplasm of breast: Secondary | ICD-10-CM

## 2019-08-06 ENCOUNTER — Other Ambulatory Visit: Payer: Self-pay | Admitting: Obstetrics and Gynecology

## 2019-08-06 DIAGNOSIS — Z1231 Encounter for screening mammogram for malignant neoplasm of breast: Secondary | ICD-10-CM

## 2019-10-02 ENCOUNTER — Ambulatory Visit: Payer: Federal, State, Local not specified - PPO

## 2019-10-02 ENCOUNTER — Inpatient Hospital Stay: Admission: RE | Admit: 2019-10-02 | Payer: Federal, State, Local not specified - PPO | Source: Ambulatory Visit

## 2019-11-06 ENCOUNTER — Inpatient Hospital Stay: Admission: RE | Admit: 2019-11-06 | Payer: Federal, State, Local not specified - PPO | Source: Ambulatory Visit

## 2019-11-09 ENCOUNTER — Other Ambulatory Visit: Payer: Self-pay

## 2019-11-09 ENCOUNTER — Ambulatory Visit
Admission: RE | Admit: 2019-11-09 | Discharge: 2019-11-09 | Disposition: A | Discharge status: Home / Self Care | Payer: Federal, State, Local not specified - PPO | Source: Ambulatory Visit | Attending: Obstetrics and Gynecology | Admitting: Obstetrics and Gynecology

## 2019-11-09 DIAGNOSIS — Z1231 Encounter for screening mammogram for malignant neoplasm of breast: Secondary | ICD-10-CM

## 2020-11-16 ENCOUNTER — Other Ambulatory Visit: Payer: Self-pay | Admitting: Family Medicine

## 2020-11-16 DIAGNOSIS — Z1231 Encounter for screening mammogram for malignant neoplasm of breast: Secondary | ICD-10-CM

## 2020-12-23 ENCOUNTER — Ambulatory Visit
Admission: RE | Admit: 2020-12-23 | Discharge: 2020-12-23 | Disposition: A | Discharge status: Home / Self Care | Payer: Federal, State, Local not specified - PPO | Source: Ambulatory Visit | Attending: Family Medicine | Admitting: Family Medicine

## 2020-12-23 ENCOUNTER — Other Ambulatory Visit: Payer: Self-pay

## 2020-12-23 DIAGNOSIS — Z1231 Encounter for screening mammogram for malignant neoplasm of breast: Secondary | ICD-10-CM

## 2020-12-28 ENCOUNTER — Other Ambulatory Visit: Payer: Self-pay | Admitting: Family Medicine

## 2020-12-28 DIAGNOSIS — N63 Unspecified lump in unspecified breast: Secondary | ICD-10-CM

## 2021-01-02 ENCOUNTER — Other Ambulatory Visit: Payer: Self-pay | Admitting: Family Medicine

## 2021-01-02 DIAGNOSIS — Z1231 Encounter for screening mammogram for malignant neoplasm of breast: Secondary | ICD-10-CM

## 2021-01-09 ENCOUNTER — Inpatient Hospital Stay: Admission: RE | Admit: 2021-01-09 | Payer: Federal, State, Local not specified - PPO | Source: Ambulatory Visit

## 2021-01-09 DIAGNOSIS — Z1231 Encounter for screening mammogram for malignant neoplasm of breast: Secondary | ICD-10-CM

## 2021-01-11 ENCOUNTER — Other Ambulatory Visit: Payer: Self-pay | Admitting: Obstetrics and Gynecology

## 2021-01-11 DIAGNOSIS — N63 Unspecified lump in unspecified breast: Secondary | ICD-10-CM

## 2021-02-17 ENCOUNTER — Other Ambulatory Visit: Payer: Self-pay

## 2021-02-17 ENCOUNTER — Ambulatory Visit
Admission: RE | Admit: 2021-02-17 | Discharge: 2021-02-17 | Disposition: A | Discharge status: Home / Self Care | Payer: Federal, State, Local not specified - PPO | Source: Ambulatory Visit | Attending: Obstetrics and Gynecology | Admitting: Obstetrics and Gynecology

## 2021-02-17 DIAGNOSIS — N63 Unspecified lump in unspecified breast: Secondary | ICD-10-CM

## 2022-01-29 DIAGNOSIS — E890 Postprocedural hypothyroidism: Secondary | ICD-10-CM | POA: Diagnosis not present

## 2022-01-29 DIAGNOSIS — Z79890 Hormone replacement therapy (postmenopausal): Secondary | ICD-10-CM | POA: Diagnosis not present

## 2022-01-29 DIAGNOSIS — Z6841 Body Mass Index (BMI) 40.0 and over, adult: Secondary | ICD-10-CM | POA: Diagnosis not present

## 2022-01-29 DIAGNOSIS — E042 Nontoxic multinodular goiter: Secondary | ICD-10-CM | POA: Diagnosis not present

## 2022-02-13 ENCOUNTER — Other Ambulatory Visit: Payer: Self-pay | Admitting: Obstetrics and Gynecology

## 2022-02-13 DIAGNOSIS — Z1231 Encounter for screening mammogram for malignant neoplasm of breast: Secondary | ICD-10-CM

## 2022-02-16 ENCOUNTER — Ambulatory Visit
Admission: RE | Admit: 2022-02-16 | Discharge: 2022-02-16 | Disposition: A | Discharge status: Home / Self Care | Payer: Federal, State, Local not specified - PPO | Source: Ambulatory Visit

## 2022-02-16 DIAGNOSIS — Z1231 Encounter for screening mammogram for malignant neoplasm of breast: Secondary | ICD-10-CM | POA: Diagnosis not present

## 2022-04-19 DIAGNOSIS — Z01419 Encounter for gynecological examination (general) (routine) without abnormal findings: Secondary | ICD-10-CM | POA: Diagnosis not present

## 2022-07-05 DIAGNOSIS — K640 First degree hemorrhoids: Secondary | ICD-10-CM | POA: Diagnosis not present

## 2022-07-05 DIAGNOSIS — Z1211 Encounter for screening for malignant neoplasm of colon: Secondary | ICD-10-CM | POA: Diagnosis not present

## 2022-09-04 DIAGNOSIS — N049 Nephrotic syndrome with unspecified morphologic changes: Secondary | ICD-10-CM | POA: Diagnosis not present

## 2022-09-04 DIAGNOSIS — D500 Iron deficiency anemia secondary to blood loss (chronic): Secondary | ICD-10-CM | POA: Diagnosis not present

## 2022-09-04 DIAGNOSIS — I129 Hypertensive chronic kidney disease with stage 1 through stage 4 chronic kidney disease, or unspecified chronic kidney disease: Secondary | ICD-10-CM | POA: Diagnosis not present

## 2022-09-04 DIAGNOSIS — N181 Chronic kidney disease, stage 1: Secondary | ICD-10-CM | POA: Diagnosis not present

## 2023-04-15 ENCOUNTER — Other Ambulatory Visit: Payer: Self-pay | Admitting: Obstetrics and Gynecology

## 2023-04-15 ENCOUNTER — Other Ambulatory Visit: Payer: Self-pay | Admitting: Family Medicine

## 2023-04-15 DIAGNOSIS — Z1231 Encounter for screening mammogram for malignant neoplasm of breast: Secondary | ICD-10-CM

## 2023-04-26 ENCOUNTER — Ambulatory Visit
Admission: RE | Admit: 2023-04-26 | Discharge: 2023-04-26 | Disposition: A | Discharge status: Home / Self Care | Payer: Federal, State, Local not specified - PPO | Source: Ambulatory Visit

## 2023-04-26 DIAGNOSIS — Z1231 Encounter for screening mammogram for malignant neoplasm of breast: Secondary | ICD-10-CM | POA: Diagnosis not present

## 2023-05-06 DIAGNOSIS — E890 Postprocedural hypothyroidism: Secondary | ICD-10-CM | POA: Diagnosis not present

## 2023-05-06 DIAGNOSIS — E042 Nontoxic multinodular goiter: Secondary | ICD-10-CM | POA: Diagnosis not present

## 2023-05-06 DIAGNOSIS — Z6841 Body Mass Index (BMI) 40.0 and over, adult: Secondary | ICD-10-CM | POA: Diagnosis not present

## 2023-09-11 DIAGNOSIS — N049 Nephrotic syndrome with unspecified morphologic changes: Secondary | ICD-10-CM | POA: Diagnosis not present

## 2023-09-11 DIAGNOSIS — I10 Essential (primary) hypertension: Secondary | ICD-10-CM | POA: Diagnosis not present

## 2023-09-11 DIAGNOSIS — N181 Chronic kidney disease, stage 1: Secondary | ICD-10-CM | POA: Diagnosis not present

## 2023-09-11 DIAGNOSIS — D500 Iron deficiency anemia secondary to blood loss (chronic): Secondary | ICD-10-CM | POA: Diagnosis not present

## 2023-09-11 DIAGNOSIS — N184 Chronic kidney disease, stage 4 (severe): Secondary | ICD-10-CM | POA: Diagnosis not present

## 2023-11-11 DIAGNOSIS — E041 Nontoxic single thyroid nodule: Secondary | ICD-10-CM | POA: Diagnosis not present

## 2023-11-11 DIAGNOSIS — E890 Postprocedural hypothyroidism: Secondary | ICD-10-CM | POA: Diagnosis not present

## 2023-12-30 DIAGNOSIS — Z7689 Persons encountering health services in other specified circumstances: Secondary | ICD-10-CM | POA: Diagnosis not present

## 2023-12-30 DIAGNOSIS — R196 Halitosis: Secondary | ICD-10-CM | POA: Diagnosis not present

## 2023-12-30 DIAGNOSIS — Z1389 Encounter for screening for other disorder: Secondary | ICD-10-CM | POA: Diagnosis not present

## 2023-12-30 DIAGNOSIS — K219 Gastro-esophageal reflux disease without esophagitis: Secondary | ICD-10-CM | POA: Diagnosis not present

## 2023-12-30 DIAGNOSIS — Z6841 Body Mass Index (BMI) 40.0 and over, adult: Secondary | ICD-10-CM | POA: Diagnosis not present

## 2024-01-15 DIAGNOSIS — J329 Chronic sinusitis, unspecified: Secondary | ICD-10-CM | POA: Diagnosis not present

## 2024-01-15 DIAGNOSIS — H66001 Acute suppurative otitis media without spontaneous rupture of ear drum, right ear: Secondary | ICD-10-CM | POA: Diagnosis not present

## 2024-01-15 DIAGNOSIS — J040 Acute laryngitis: Secondary | ICD-10-CM | POA: Diagnosis not present

## 2024-02-13 DIAGNOSIS — Z6841 Body Mass Index (BMI) 40.0 and over, adult: Secondary | ICD-10-CM | POA: Diagnosis not present

## 2024-02-13 DIAGNOSIS — K219 Gastro-esophageal reflux disease without esophagitis: Secondary | ICD-10-CM | POA: Diagnosis not present

## 2024-02-13 DIAGNOSIS — R718 Other abnormality of red blood cells: Secondary | ICD-10-CM | POA: Diagnosis not present

## 2024-02-13 DIAGNOSIS — R196 Halitosis: Secondary | ICD-10-CM | POA: Diagnosis not present

## 2024-04-09 DIAGNOSIS — N62 Hypertrophy of breast: Secondary | ICD-10-CM | POA: Diagnosis not present

## 2024-04-09 DIAGNOSIS — Z01419 Encounter for gynecological examination (general) (routine) without abnormal findings: Secondary | ICD-10-CM | POA: Diagnosis not present

## 2024-04-09 DIAGNOSIS — Z124 Encounter for screening for malignant neoplasm of cervix: Secondary | ICD-10-CM | POA: Diagnosis not present

## 2024-04-09 DIAGNOSIS — Z6841 Body Mass Index (BMI) 40.0 and over, adult: Secondary | ICD-10-CM | POA: Diagnosis not present

## 2024-04-27 ENCOUNTER — Other Ambulatory Visit: Payer: Self-pay | Admitting: Obstetrics and Gynecology

## 2024-04-27 DIAGNOSIS — Z1231 Encounter for screening mammogram for malignant neoplasm of breast: Secondary | ICD-10-CM

## 2024-05-18 ENCOUNTER — Encounter: Payer: Self-pay | Admitting: Plastic Surgery

## 2024-05-18 ENCOUNTER — Ambulatory Visit
Admission: RE | Admit: 2024-05-18 | Discharge: 2024-05-18 | Disposition: A | Discharge status: Home / Self Care | Source: Ambulatory Visit

## 2024-05-18 ENCOUNTER — Ambulatory Visit: Admitting: Plastic Surgery

## 2024-05-18 ENCOUNTER — Other Ambulatory Visit: Payer: Self-pay | Admitting: Obstetrics and Gynecology

## 2024-05-18 VITALS — BP 144/96 | HR 65 | Ht 69.0 in | Wt 315.0 lb

## 2024-05-18 DIAGNOSIS — M542 Cervicalgia: Secondary | ICD-10-CM

## 2024-05-18 DIAGNOSIS — Z6841 Body Mass Index (BMI) 40.0 and over, adult: Secondary | ICD-10-CM

## 2024-05-18 DIAGNOSIS — M546 Pain in thoracic spine: Secondary | ICD-10-CM | POA: Diagnosis not present

## 2024-05-18 DIAGNOSIS — Z1231 Encounter for screening mammogram for malignant neoplasm of breast: Secondary | ICD-10-CM

## 2024-05-18 DIAGNOSIS — N62 Hypertrophy of breast: Secondary | ICD-10-CM

## 2024-05-18 NOTE — Progress Notes (Signed)
 Referring Provider Armond Cape, MD [address] [place],  [place] [address]   CC:  Chief Complaint  Patient presents with   Consult           Sara Torres is an 48 y.o. female.  HPI: Sara Torres is a 48 year old female who presents today with complaints of large breasts which she believes contributed to her upper back and neck pain discomfort.  She is interested in a bilateral breast reduction.  Allergies  Allergen Reactions   Amoxicillin Other (See Comments)    Altered mental status & lightheadedness. Has patient had a PCN reaction causing immediate rash, facial/tongue/throat swelling, SOB or lightheadedness with hypotension: Yes Has patient had a PCN reaction causing severe rash involving mucus membranes or skin necrosis: No Has patient had a PCN reaction that required hospitalization: No Has patient had a PCN reaction occurring within the last 10 years: No If all of the above answers are NO, then may proceed with Cephalosporin use.     Vicodin [Hydrocodone-Acetaminophen ] Hives and Itching   Hydrocodone Hives, Itching and Rash    Outpatient Encounter Medications as of 05/18/2024  Medication Sig   Calcium Carb-Cholecalciferol (CALCIUM 600 + D PO) Take 1 tablet by mouth daily.   ferrous sulfate 325 (65 FE) MG tablet Take 325 mg by mouth 2 (two) times daily with a meal.   furosemide (LASIX) 20 MG tablet Take 20 mg by mouth daily as needed for edema.   labetalol  (NORMODYNE ) 200 MG tablet Take 200 mg by mouth daily.   predniSONE (DELTASONE) 20 MG tablet Take 20 mg by mouth daily as needed (swelling).   SYNTHROID  125 MCG tablet Take 125 mcg by mouth daily.   No facility-administered encounter medications on file as of 05/18/2024.     Past Medical History:  Diagnosis Date   Allergy    HPV (human papilloma virus) infection    HTN (hypertension)    Hyperthyroidism    LGSIL (low grade squamous intraepithelial dysplasia)    Nephrotic syndrome     followed by Dr Lonna  Prednisone prn   Nephrotic syndrome     Past Surgical History:  Procedure Laterality Date   COLPOSCOPY  2011   DILITATION & CURRETTAGE/HYSTROSCOPY WITH VERSAPOINT RESECTION N/A 09/11/2013   Procedure: DILATATION & CURETTAGE/HYSTEROSCOPY WITH VERSAPOINT RESECTION;  Surgeon: Nena DELENA App, MD;  Location: [facility];  Service: Gynecology;  Laterality: N/A;   HERNIA REPAIR  2008 & 2010   with mesh   MYOMECTOMY  2010   #17    Family History  Problem Relation Age of Onset   Breast cancer Mother    Pancreatic cancer Paternal Grandmother 56   Cancer Paternal Aunt        stomach    Social History   Social History Narrative   ** Merged History Encounter **         Review of Systems General: Denies fevers, chills, weight loss CV: Denies chest pain, shortness of breath, palpitations Breast: Large breasts contributing to upper back and neck pain  Physical Exam    05/18/2024   11:22 AM 07/30/2018    9:11 AM 07/30/2018    8:09 AM  Vitals with BMI  Height 5' 9  5' 9  Weight 315 lbs  255 lbs  BMI 46.5  37.64  Systolic 144 129 869  Diastolic 96 80 76  Pulse 65 59 63    General:  No acute distress,  Alert and oriented, Non-Toxic, Normal speech  and affect Breast: Patient not examined on this visit Mammogram: August 2024 BI-RADS 1 Assessment/Plan Macromastia: Patient has obviously large breasts.  She feels that they are contributing to her upper back and neck pain.  Unfortunately she is currently BMI 46.5.  I have discussed with her the risk of surgical Patient is a increases dramatically, 940.  For this reason I asked that the patient's I performed breast reductions on the BMI of 40 or less.  For her this is a weight loss of approximately 45 pounds.  I have offered to refer her to healthy weight and wellness.  I have also encouraged her to begin a consistent walking program.  She would like to follow-up in 3 months to follow-up on her weight loss.  Will  schedule for 3 months.  [name] 05/18/2024, 11:56 AM

## 2024-06-09 ENCOUNTER — Encounter (INDEPENDENT_AMBULATORY_CARE_PROVIDER_SITE_OTHER): Payer: Self-pay

## 2024-06-18 ENCOUNTER — Encounter (INDEPENDENT_AMBULATORY_CARE_PROVIDER_SITE_OTHER): Payer: Self-pay | Admitting: Adult Health

## 2024-06-18 ENCOUNTER — Ambulatory Visit (INDEPENDENT_AMBULATORY_CARE_PROVIDER_SITE_OTHER): Admitting: Adult Health

## 2024-06-18 VITALS — BP 135/82 | HR 69 | Temp 97.9°F | Ht 69.0 in | Wt 309.0 lb

## 2024-06-18 DIAGNOSIS — I10 Essential (primary) hypertension: Secondary | ICD-10-CM | POA: Diagnosis not present

## 2024-06-18 DIAGNOSIS — N049 Nephrotic syndrome with unspecified morphologic changes: Secondary | ICD-10-CM | POA: Diagnosis not present

## 2024-06-18 DIAGNOSIS — D649 Anemia, unspecified: Secondary | ICD-10-CM | POA: Diagnosis not present

## 2024-06-18 DIAGNOSIS — E559 Vitamin D deficiency, unspecified: Secondary | ICD-10-CM

## 2024-06-18 DIAGNOSIS — Z6841 Body Mass Index (BMI) 40.0 and over, adult: Secondary | ICD-10-CM

## 2024-06-18 DIAGNOSIS — Z0000 Encounter for general adult medical examination without abnormal findings: Secondary | ICD-10-CM

## 2024-06-18 DIAGNOSIS — Z0289 Encounter for other administrative examinations: Secondary | ICD-10-CM

## 2024-06-18 NOTE — Progress Notes (Signed)
 Office: [phone]  /  Fax: [phone]   Initial Visit    Sara Torres was seen in clinic today to evaluate for obesity. She is interested in losing weight to improve overall health and reduce the risk of weight related complications. She presents today to review program treatment options, initial physical assessment, and evaluation.     She was referred by: Specialist  When asked what else they would like to accomplish? She states: Adopt a healthier eating pattern and lifestyle, Improve energy levels and physical activity, Improve existing medical conditions, Improve quality of life, and Need to loss min of 45 lbs to be eligible for bilateral breast reduction.  When asked how has your weight affected you? 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  Highest weight: 315 lbs  Some associated conditions: Hypertension and Kidney disease  Contributing factors: disruption of circadian rhythm / sleep disordered breathing, consumption of processed foods, moderate to high levels of stress, reduced physical activity, multiple weight loss attempts in the past, sedentary job, and need for convenient foods  Weight promoting medications identified: None  Prior weight loss attempts: Weight Watchers  Current nutrition plan: None  Current level of physical activity: Other: Stationary Bike 2 x week  Current or previous pharmacotherapy: GLP-1 and GLP-1 + GIP  Response to medication: Wegovy ineffective, modest weight loss with Zepbound- stopped due to insurance denial   Past medical history includes:   Past Medical History:  Diagnosis Date   Allergy    HPV (human papilloma virus) infection    HTN (hypertension)    Hyperthyroidism    LGSIL (low grade squamous intraepithelial dysplasia)    Nephrotic  syndrome    followed by Dr Lonna  Prednisone prn   Nephrotic syndrome      Objective    BP 135/82   Pulse 69   Temp 97.9 F (36.6 C)   Ht 5' 9 (1.753 m)   Wt (!) 309 lb (140.2 kg)   SpO2 99%   BMI 45.63 kg/m  She was weighed on the bioimpedance scale: Body mass index is 45.63 kg/m.  Body Fat%:50.4, Visceral Fat Rating:17, Weight trend over the last 12 months: Decreasing  General:  Alert, oriented and cooperative. Patient is in no acute distress.  Respiratory: Normal respiratory effort, no problems with respiration noted   Gait: able to ambulate independently  Mental Status: Normal mood and affect. Normal behavior. Normal judgment and thought content.   DIAGNOSTIC DATA REVIEWED:  BMET    Component Value Date/Time   NA 138 04/23/2018 0811   K 3.6 04/23/2018 0811   CL 104 04/23/2018 0811   CO2 24 04/23/2018 0811   GLUCOSE 132 (H) 04/23/2018 0811   BUN 7 04/23/2018 0811   CREATININE 0.95 04/23/2018 0811   CALCIUM 8.9 04/23/2018 0811   GFRNONAA >60 04/23/2018 0811   GFRAA >60 04/23/2018 0811   No results found for: HGBA1C No results found for: INSULIN CBC    Component Value Date/Time   WBC 7.0 04/23/2018 0811   RBC 3.81 (L) 04/23/2018 0811   HGB 8.3 (L) 04/23/2018 0811   HCT 28.0 (L) 04/23/2018 0811   PLT 215 04/23/2018 0811   MCV 73.5 (L) 04/23/2018 0811   MCH 21.8 (L) 04/23/2018 0811   MCHC 29.6 (L) 04/23/2018 0811   RDW 23.8 (H) 04/23/2018 0811   Iron/TIBC/Ferritin/ %  Sat    Component Value Date/Time   IRON 17 (L) 04/22/2018 2102   TIBC 356 04/22/2018 2102   FERRITIN 8 (L) 04/22/2018 2102   IRONPCTSAT 5 (L) 04/22/2018 2102   Lipid Panel  No results found for: CHOL, TRIG, HDL, CHOLHDL, VLDL, LDLCALC, LDLDIRECT Hepatic Function Panel     Component Value Date/Time   PROT 6.9 04/22/2018 1810   ALBUMIN 3.4 (L) 04/22/2018 1810   AST 13 (L) 04/22/2018 1810   ALT 10 04/22/2018 1810   ALKPHOS 61 04/22/2018 1810   BILITOT 1.2  04/22/2018 1810   No results found for: TSH   Assessment and Plan   Healthcare maintenance  Vitamin D deficiency  Hypertension, unspecified type  Nephrotic syndrome  Symptomatic anemia  Morbid obesity (HCC), STARTING BMI 45.7   Assessment and Plan          ESTABLISH WITH [facility]    Obesity Treatment / Action Plan:  Patient will work on garnering support from family and friends to begin weight loss journey. Will work on eliminating or reducing the presence of highly palatable, calorie dense foods in the home. Will complete provided nutritional and psychosocial assessment questionnaire before the next appointment. Will be scheduled for indirect calorimetry to determine resting energy expenditure in a fasting state.  This will allow us  to create a reduced calorie, high-protein meal plan to promote loss of fat mass while preserving muscle mass. Will think about ideas on how to incorporate physical activity into their daily routine. Counseled on the health benefits of losing 5%-15% of total body weight. Was counseled on nutritional approaches to weight loss and benefits of reducing processed foods and consuming plant-based foods and high quality protein as part of nutritional weight management. Was counseled on pharmacotherapy and role as an adjunct in weight management.   Obesity Education Performed Today:  She was weighed on the bioimpedance scale and results were discussed and documented in the synopsis.  We discussed obesity as a disease and the importance of a more detailed evaluation of all the factors contributing to the disease.  We discussed the importance of long term lifestyle changes which include nutrition, exercise and behavioral modifications as well as the importance of customizing this to her specific health and social needs.  We discussed the benefits of reaching a healthier weight to alleviate the symptoms of existing conditions and reduce the risks of the  biomechanical, metabolic and psychological effects of obesity.  We reviewed the four pillars of obesity medicine and importance of using a multimodal approach.  We reviewed the basic principles in weight management.   Sara Torres appears to be in the action stage of change and states they are ready to start intensive lifestyle modifications and behavioral modifications.  I have spent 25 minutes in the care of the patient today including: 3 minutes before the visit reviewing and preparing the chart. 20 minutes face-to-face assessing and reviewing listed medical problems as outlined in obesity care plan, providing nutritional and behavioral counseling on topics outlined in the obesity care plan, counseling regarding anti-obesity medication as outlined in obesity care plan, independently interpreting test results and goals of care, as described in assessment and plan, reviewing and discussing biometric information and progress, and reviewing latest PCP notes and specialist consultations 3 minutes after the visit updating chart and documentation of encounter.  Reviewed by clinician on day of visit: allergies, medications, problem list, medical history, surgical history, family history, social history, and previous encounter notes pertinent to obesity  diagnosis.  Sara Torres d. Yanin Muhlestein, NP-C

## 2024-07-23 ENCOUNTER — Encounter (INDEPENDENT_AMBULATORY_CARE_PROVIDER_SITE_OTHER): Payer: Self-pay | Admitting: Family Medicine

## 2024-07-23 ENCOUNTER — Telehealth (INDEPENDENT_AMBULATORY_CARE_PROVIDER_SITE_OTHER): Payer: Self-pay

## 2024-07-23 ENCOUNTER — Ambulatory Visit (INDEPENDENT_AMBULATORY_CARE_PROVIDER_SITE_OTHER): Admitting: Family Medicine

## 2024-07-23 VITALS — BP 137/94 | HR 81 | Temp 98.4°F | Ht 69.0 in | Wt 308.0 lb

## 2024-07-23 DIAGNOSIS — Z6841 Body Mass Index (BMI) 40.0 and over, adult: Secondary | ICD-10-CM | POA: Insufficient documentation

## 2024-07-23 DIAGNOSIS — E538 Deficiency of other specified B group vitamins: Secondary | ICD-10-CM | POA: Diagnosis not present

## 2024-07-23 DIAGNOSIS — R0609 Other forms of dyspnea: Secondary | ICD-10-CM | POA: Diagnosis not present

## 2024-07-23 DIAGNOSIS — R739 Hyperglycemia, unspecified: Secondary | ICD-10-CM | POA: Diagnosis not present

## 2024-07-23 DIAGNOSIS — E039 Hypothyroidism, unspecified: Secondary | ICD-10-CM | POA: Diagnosis not present

## 2024-07-23 DIAGNOSIS — D509 Iron deficiency anemia, unspecified: Secondary | ICD-10-CM

## 2024-07-23 DIAGNOSIS — Z1331 Encounter for screening for depression: Secondary | ICD-10-CM | POA: Diagnosis not present

## 2024-07-23 DIAGNOSIS — R5383 Other fatigue: Secondary | ICD-10-CM

## 2024-07-23 DIAGNOSIS — I10 Essential (primary) hypertension: Secondary | ICD-10-CM | POA: Diagnosis not present

## 2024-07-23 DIAGNOSIS — E559 Vitamin D deficiency, unspecified: Secondary | ICD-10-CM | POA: Diagnosis not present

## 2024-07-23 NOTE — Telephone Encounter (Signed)
 Patient called back and stated that a prior auth was not needed for the echocardiogram.

## 2024-07-23 NOTE — Telephone Encounter (Signed)
 Call to [employer] to get authorization for Echocardiogram Complete.  Per the customer service representative, they were unable to find her in the their system.

## 2024-07-23 NOTE — Telephone Encounter (Signed)
 Call to patient to ask her to reach out to her insurance company to find out why they cannot find her in their system.  Patient will call us  back later and let us  know.

## 2024-07-23 NOTE — Progress Notes (Signed)
 Office: [phone]  /  Fax: [phone]  WEIGHT SUMMARY AND BIOMETRICS  Anthropometric Measurements Height: 5' 9 (1.753 m) Weight: (!) 308 lb (139.7 kg) BMI (Calculated): 45.46 Starting Weight: 308 lb Peak Weight: 315 lb   Body Composition  Body Fat %: 50 % Fat Mass (lbs): 154 lbs Muscle Mass (lbs): 146.2 lbs Total Body Water (lbs): 107 lbs Visceral Fat Rating : 16   Other Clinical Data Fasting: yes Labs: yes Today's Visit #: 1 Starting Date: 07/23/24    Chief Complaint: OBESITY    History of Present Illness Sara Torres is a 48 year old female who presents for a workup to determine ideal treatment options for obesity management.  She is interested in exploring dietary and exercise interventions for weight loss. She experiences fatigue and dyspnea on exertion. She has a sweet tooth and dislikes certain foods such as pickles, onions, tomatoes, sauerkraut, and relish. She often misses breakfast on weekends due to sleeping in and has difficulty recognizing when she is comfortably full, often only knowing hunger or feeling stuffed.  She has a history of hypertension with current blood pressure readings of 151/73 mmHg and 147/94 mmHg. She is currently taking labetalol  20 mg for hypertension management.  She has a documented vitamin D deficiency and is taking vitamin D 4000 IU daily.  She has a history of hyperglycemia noted in previous labs, although her hemoglobin A1c has been normal.  She has hypothyroidism and is on Synthroid .  She has a history of low B12 levels, although recent levels have not been checked. She also has iron deficiency anemia and is not currently on iron supplementation. She reports a history of severe anemia requiring a blood transfusion 2-3 years ago, attributed to heavy menstrual bleeding due to fibroids. She is now premenopausal and no longer experiences heavy periods.  She lives with her husband, who practices intermittent fasting  and has recently lost weight. She has a history of using Weight Watchers for weight management.  Testing today includes:  Basal Metabolic Rate calculated via Bioimpedance scale 2220 Resting Energy Expenditure measured via Indirect Calorimeter 1714 and is lower  than expected. PHQ-9 score was WNL at 2 Epworth Sleepiness Score was elevated at 12 ECG shows NSR @ 68 BPM with possible LAD, no QT prolongation, no previous ECG for comparison       PHYSICAL EXAM:  Blood pressure (!) 137/94, pulse 81, temperature 98.4 F (36.9 C), height 5' 9 (1.753 m), weight (!) 308 lb (139.7 kg), SpO2 98%. Body mass index is 45.48 kg/m.  DIAGNOSTIC DATA REVIEWED:  BMET    Component Value Date/Time   NA 138 04/23/2018 0811   K 3.6 04/23/2018 0811   CL 104 04/23/2018 0811   CO2 24 04/23/2018 0811   GLUCOSE 132 (H) 04/23/2018 0811   BUN 7 04/23/2018 0811   CREATININE 0.95 04/23/2018 0811   CALCIUM 8.9 04/23/2018 0811   GFRNONAA >60 04/23/2018 0811   GFRAA >60 04/23/2018 0811   No results found for: HGBA1C No results found for: INSULIN No results found for: TSH CBC    Component Value Date/Time   WBC 7.0 04/23/2018 0811   RBC 3.81 (L) 04/23/2018 0811   HGB 8.3 (L) 04/23/2018 0811   HCT 28.0 (L) 04/23/2018 0811   PLT 215 04/23/2018 0811   MCV 73.5 (L) 04/23/2018 0811   MCH 21.8 (L) 04/23/2018 0811   MCHC 29.6 (L) 04/23/2018 0811   RDW 23.8 (H) 04/23/2018 9188   Iron Studies  Component Value Date/Time   IRON 17 (L) 04/22/2018 2102   TIBC 356 04/22/2018 2102   FERRITIN 8 (L) 04/22/2018 2102   IRONPCTSAT 5 (L) 04/22/2018 2102   Lipid Panel  No results found for: CHOL, TRIG, HDL, CHOLHDL, VLDL, LDLCALC, LDLDIRECT Hepatic Function Panel     Component Value Date/Time   PROT 6.9 04/22/2018 1810   ALBUMIN 3.4 (L) 04/22/2018 1810   AST 13 (L) 04/22/2018 1810   ALT 10 04/22/2018 1810   ALKPHOS 61 04/22/2018 1810   BILITOT 1.2 04/22/2018 1810   No results found  for: TSH Nutritional No results found for: VD25OH   Assessment and Plan Assessment & Plan Morbid obesity  Morbid obesity. Undergoing comprehensive workup to identify mechanisms contributing to obesity, including metabolic rate, hunger signals, and fat storage efficiency. Obesity influenced by genetic factors and multiple physiological mechanisms that resist weight loss. Not currently considering weight loss surgery unless other interventions fail. Emphasized that traditional advice of 'exercise more, eat less' is not entirely accurate due to genetic predispositions and physiological mechanisms. - Initiate a dietary plan with specific food choices to target obesity mechanisms. - Provide a grocery list and a modified category two plan with 300 discretionary calories. - Instruct to use a food scale for portion control, particularly for protein. - Advise against strenuous exercise; continue current activity level. - Schedule follow-up in two weeks to review progress and lab results.  Dyspnea on exertion, likely related to obesity and exercise intolerance Dyspnea on exertion likely related to obesity and exercise intolerance. Plan to address through weight management and exercise as tolerated. Further workup will be considered if symptoms do not improve with weight loss. ECG shows possible LAE and with her weight and HTN, may be concerning - Monitor dyspnea symptoms and will work on weight loss - Schedule echo and follow  Fatigue, evaluation for secondary causes Fatigue of unclear etiology. Potential secondary causes include hypothyroidism, vitamin deficiencies, and anemia. Plan to evaluate these conditions further to determine their contribution to fatigue. - Order lab tests to evaluate thyroid  function, vitamin levels, and anemia.  Hypothyroidism Hypothyroidism managed with Synthroid . Suspect that thyroid  levels may not be optimal, contributing to fatigue. - Order lab tests to evaluate  thyroid  function.  Deficiency of B group vitamins (including B12) Low B12 levels. Plan to reassess current B12 status as part of the fatigue workup. - Order lab tests to evaluate B12 levels.  Iron deficiency anemia, evaluation for recurrence Iron deficiency anemia, previously severe enough to require a blood transfusion. Currently not on iron supplementation. Plan to evaluate for recurrence, especially since anemia can contribute to fatigue and affect metabolism. - Order lab tests to evaluate iron levels and anemia status. - Follow up in two weeks to discuss lab results.  Essential hypertension Essential hypertension with current blood pressure readings of 151/73 and 147/94. On labetalol  20 mg. The timing of medication intake may have contributed to the elevated readings. - Continue labetalol  20 mg. - Monitor blood pressure readings.  Hyperglycemia, evaluation for insulin resistance History of hyperglycemia with normal hemoglobin A1c in the past. Plan to evaluate for insulin resistance as a potential cause of hyperglycemia. - Order lab tests to evaluate for insulin resistance. - Follow up in two weeks to discuss lab results.  Vitamin D deficiency Vitamin D deficiency currently managed with 4000 IU of vitamin D daily. Due for lab evaluation to assess current levels. - Order lab tests to evaluate vitamin D levels.     I  personally spent a total of 68 minutes in the care of the patient today including preparing to see the patient, reviewing separately obtained history, performing a medically appropriate evaluation of current problems, placing orders in the EMR, documenting clinical information in the EMR, customized nutritional counseling for their specific health and social needs, independently interpreting results, discussing results with the patient and educating them on how these results can affect their health and weight, and explaining the pathophysiology of obesity and how it is  significantly more complex than eat less and exercise more.  Sara Torres was counseled on the importance of maintaining healthy lifestyle habits, including balanced nutrition, regular physical activity, and behavioral modifications, while taking antiobesity medication.  Patient verbalized understanding that medication is an adjunct to, not a replacement for, lifestyle changes and that the long-term success and weight maintenance depend on continued adherence to these strategies.   Sara Torres was informed of the importance of frequent follow up visits to maximize her success with intensive lifestyle modifications for her obesity and obesity related health conditions as recommended by USPSTF and CMS guidelines   [name], MD

## 2024-07-23 NOTE — Addendum Note (Signed)
 Addended by: LAFE BAKER CROME on: 07/23/2024 05:25 PM   Modules accepted: Orders

## 2024-07-23 NOTE — Addendum Note (Signed)
 Addended by: LAFE BAKER CROME on: 07/23/2024 12:41 PM   Modules accepted: Orders

## 2024-07-24 LAB — T3: T3, Total: 98 ng/dL (ref 71–180)

## 2024-07-24 LAB — IRON AND TIBC
Iron Saturation: 17 % (ref 15–55)
Iron: 53 ug/dL (ref 27–159)
Total Iron Binding Capacity: 318 ug/dL (ref 250–450)
UIBC: 265 ug/dL (ref 131–425)

## 2024-07-24 LAB — CBC WITH DIFFERENTIAL/PLATELET
Basophils Absolute: 0 x10E3/uL (ref 0.0–0.2)
Basos: 1 %
EOS (ABSOLUTE): 0.1 x10E3/uL (ref 0.0–0.4)
Eos: 3 %
Hematocrit: 44.2 % (ref 34.0–46.6)
Hemoglobin: 14.2 g/dL (ref 11.1–15.9)
Immature Grans (Abs): 0 x10E3/uL (ref 0.0–0.1)
Immature Granulocytes: 0 %
Lymphocytes Absolute: 1 x10E3/uL (ref 0.7–3.1)
Lymphs: 24 %
MCH: 25.6 pg — ABNORMAL LOW (ref 26.6–33.0)
MCHC: 32.1 g/dL (ref 31.5–35.7)
MCV: 80 fL (ref 79–97)
Monocytes Absolute: 0.3 x10E3/uL (ref 0.1–0.9)
Monocytes: 8 %
Neutrophils Absolute: 2.8 x10E3/uL (ref 1.4–7.0)
Neutrophils: 64 %
Platelets: 218 x10E3/uL (ref 150–450)
RBC: 5.54 x10E6/uL — ABNORMAL HIGH (ref 3.77–5.28)
RDW: 13.7 % (ref 11.7–15.4)
WBC: 4.4 x10E3/uL (ref 3.4–10.8)

## 2024-07-24 LAB — CMP14+EGFR
ALT: 15 IU/L (ref 0–32)
AST: 15 IU/L (ref 0–40)
Albumin: 4.7 g/dL (ref 3.9–4.9)
Alkaline Phosphatase: 110 IU/L (ref 41–116)
BUN/Creatinine Ratio: 19 (ref 9–23)
BUN: 19 mg/dL (ref 6–24)
Bilirubin Total: 1 mg/dL (ref 0.0–1.2)
CO2: 23 mmol/L (ref 20–29)
Calcium: 9.9 mg/dL (ref 8.7–10.2)
Chloride: 97 mmol/L (ref 96–106)
Creatinine, Ser: 1.01 mg/dL — ABNORMAL HIGH (ref 0.57–1.00)
Globulin, Total: 3.3 g/dL (ref 1.5–4.5)
Glucose: 83 mg/dL (ref 70–99)
Potassium: 4.1 mmol/L (ref 3.5–5.2)
Sodium: 138 mmol/L (ref 134–144)
Total Protein: 8 g/dL (ref 6.0–8.5)
eGFR: 68 mL/min/1.73 (ref >59)

## 2024-07-24 LAB — LIPID PANEL WITH LDL/HDL RATIO
Cholesterol, Total: 178 mg/dL (ref 100–199)
HDL: 62 mg/dL (ref >39)
LDL Chol Calc (NIH): 100 mg/dL — ABNORMAL HIGH (ref 0–99)
LDL/HDL Ratio: 1.6 ratio (ref 0.0–3.2)
Triglycerides: 87 mg/dL (ref 0–149)
VLDL Cholesterol Cal: 16 mg/dL (ref 5–40)

## 2024-07-24 LAB — VITAMIN D 25 HYDROXY (VIT D DEFICIENCY, FRACTURES): Vit D, 25-Hydroxy: 51.3 ng/mL (ref 30.0–100.0)

## 2024-07-24 LAB — TSH: TSH: 3.97 u[IU]/mL (ref 0.450–4.500)

## 2024-07-24 LAB — T4, FREE: Free T4: 1.32 ng/dL (ref 0.82–1.77)

## 2024-07-24 LAB — VITAMIN B12: Vitamin B-12: 342 pg/mL (ref 232–1245)

## 2024-07-24 LAB — HEMOGLOBIN A1C
Est. average glucose Bld gHb Est-mCnc: 108 mg/dL
Hgb A1c MFr Bld: 5.4 % (ref 4.8–5.6)

## 2024-07-24 LAB — FOLATE: Folate: 4.1 ng/mL (ref >3.0)

## 2024-07-24 LAB — INSULIN, RANDOM: INSULIN: 12.4 u[IU]/mL (ref 2.6–24.9)

## 2024-07-24 LAB — FERRITIN: Ferritin: 99 ng/mL (ref 15–150)

## 2024-08-03 NOTE — Addendum Note (Signed)
 Addended by: LAFE BAKER CROME on: 08/03/2024 08:05 AM   Modules accepted: Orders

## 2024-08-06 ENCOUNTER — Encounter (INDEPENDENT_AMBULATORY_CARE_PROVIDER_SITE_OTHER): Payer: Self-pay | Admitting: Family Medicine

## 2024-08-06 ENCOUNTER — Ambulatory Visit (INDEPENDENT_AMBULATORY_CARE_PROVIDER_SITE_OTHER): Payer: Self-pay | Admitting: Family Medicine

## 2024-08-06 VITALS — BP 124/82 | HR 64 | Temp 98.2°F | Ht 69.0 in | Wt 306.0 lb

## 2024-08-06 DIAGNOSIS — E88819 Insulin resistance, unspecified: Secondary | ICD-10-CM | POA: Diagnosis not present

## 2024-08-06 DIAGNOSIS — E538 Deficiency of other specified B group vitamins: Secondary | ICD-10-CM | POA: Diagnosis not present

## 2024-08-06 DIAGNOSIS — E559 Vitamin D deficiency, unspecified: Secondary | ICD-10-CM

## 2024-08-06 DIAGNOSIS — Z6841 Body Mass Index (BMI) 40.0 and over, adult: Secondary | ICD-10-CM

## 2024-08-06 DIAGNOSIS — E669 Obesity, unspecified: Secondary | ICD-10-CM

## 2024-08-06 NOTE — Progress Notes (Signed)
 Office: [phone]  /  Fax: [phone]  WEIGHT SUMMARY AND BIOMETRICS  Anthropometric Measurements Height: 5' 9 (1.753 m) Weight: (!) 306 lb (138.8 kg) BMI (Calculated): 45.17 Weight at Last Visit: 308 lb Weight Lost Since Last Visit: 2 lb Weight Gained Since Last Visit: 0 Starting Weight: 308 lb Total Weight Loss (lbs): 2 lb (0.907 kg) Peak Weight: 315 lb   Body Composition  Body Fat %: 51.2 % Fat Mass (lbs): 156.8 lbs Muscle Mass (lbs): 142 lbs Total Body Water (lbs): 109.6 lbs Visceral Fat Rating : 17   Other Clinical Data RMR: 1714 Fasting: no Labs: no Today's Visit #: 2 Starting Date: 07/23/24    Chief Complaint: OBESITY   History of Present Illness Sara Torres is a 48 year old female with obesity who presents for a follow-up on her obesity treatment plan and progress assessment.  She is adhering to a category two eating plan approximately ninety percent of the time, focusing on increasing her intake of fruits, vegetables, and protein, and ensuring adequate hydration. She is making efforts not to skip meals and generally achieves seven to nine hours of sleep per night. Exercise has not yet been incorporated into her treatment plan.  She has lost two pounds over the past two weeks. Sundays present a challenge due to church commitments, which sometimes disrupt her meal planning. On one occasion, she ate at church and later had a sandwich at home but went to bed hungry, possibly due to missing her usual apple. On other days, when she follows the plan, she does not experience hunger.  Her recent workup for obesity included tests that showed good electrolyte balance, almost perfect kidney function, and normal liver function. Her cholesterol levels were nearly optimal, with good cholesterol levels and perfect triglycerides. Her low-density lipoprotein cholesterol was at 100, slightly above the target of under 100.  She is currently taking 4000 IU of  vitamin D weekly, which has maintained her vitamin D levels within the desired range. Her B12 levels remain lower than ideal, although they have improved compared to previous years. She is not on B12 supplementation but is now on a B12-rich diet.  Her fasting glucose and three-month average blood sugar levels were good, but her fasting insulin level was elevated at 12. She has a family history of diabetes, with both maternal and paternal grandmothers having had the condition. Her maternal grandmother also had pancreatic cancer.      PHYSICAL EXAM:  Blood pressure 124/82, pulse 64, temperature 98.2 F (36.8 C), height 5' 9 (1.753 m), weight (!) 306 lb (138.8 kg), SpO2 99%. Body mass index is 45.19 kg/m.  DIAGNOSTIC DATA REVIEWED:  BMET    Component Value Date/Time   NA 138 07/23/2024 0920   K 4.1 07/23/2024 0920   CL 97 07/23/2024 0920   CO2 23 07/23/2024 0920   GLUCOSE 83 07/23/2024 0920   GLUCOSE 132 (H) 04/23/2018 0811   BUN 19 07/23/2024 0920   CREATININE 1.01 (H) 07/23/2024 0920   CALCIUM 9.9 07/23/2024 0920   GFRNONAA >60 04/23/2018 0811   GFRAA >60 04/23/2018 0811   Lab Results  Component Value Date   HGBA1C 5.4 07/23/2024   Lab Results  Component Value Date   INSULIN 12.4 07/23/2024   Lab Results  Component Value Date   TSH 3.970 07/23/2024   CBC    Component Value Date/Time   WBC 4.4 07/23/2024 0920   WBC 7.0 04/23/2018 0811   RBC 5.54 (H) 07/23/2024  0920   RBC 3.81 (L) 04/23/2018 0811   HGB 14.2 07/23/2024 0920   HCT 44.2 07/23/2024 0920   PLT 218 07/23/2024 0920   MCV 80 07/23/2024 0920   MCH 25.6 (L) 07/23/2024 0920   MCH 21.8 (L) 04/23/2018 0811   MCHC 32.1 07/23/2024 0920   MCHC 29.6 (L) 04/23/2018 0811   RDW 13.7 07/23/2024 0920   Iron Studies    Component Value Date/Time   IRON 53 07/23/2024 0920   TIBC 318 07/23/2024 0920   FERRITIN 99 07/23/2024 0920   IRONPCTSAT 17 07/23/2024 0920   Lipid Panel     Component Value Date/Time    CHOL 178 07/23/2024 0920   TRIG 87 07/23/2024 0920   HDL 62 07/23/2024 0920   LDLCALC 100 (H) 07/23/2024 0920   Hepatic Function Panel     Component Value Date/Time   PROT 8.0 07/23/2024 0920   ALBUMIN 4.7 07/23/2024 0920   AST 15 07/23/2024 0920   ALT 15 07/23/2024 0920   ALKPHOS 110 07/23/2024 0920   BILITOT 1.0 07/23/2024 0920      Component Value Date/Time   TSH 3.970 07/23/2024 0920   Nutritional Lab Results  Component Value Date   VD25OH 51.3 07/23/2024     Assessment and Plan Assessment & Plan Obesity Management is ongoing with a focus on dietary modifications. She adheres to the category two eating plan 90% of the time, with challenges on Sundays due to social events. She has lost two pounds in the last two weeks, indicating progress. The eating plan emphasizes fruits, vegetables, protein, and hydration. She is not yet assigned an exercise regimen. Test results show good electrolyte balance, kidney function, liver function, and cholesterol levels, with LDL slightly above target. Insulin resistance is present, contributing to weight management challenges. - Continue category two eating plan with emphasis on fruits, vegetables, protein, and hydration. - Allow occasional rice and pasta with protein-rich meals, adjusting other carbohydrate intake accordingly. - Implement 'don't let it touch' strategy for portion control during Thanksgiving. - Continue to monitor weight loss progress with a goal of one pound of fat loss per week.  Insulin resistance Present, characterized by elevated insulin levels despite normal fasting glucose and HbA1c. This condition increases the risk of diabetes and complicates weight loss efforts. Insulin is a weight-gaining hormone, and high levels can hinder weight loss. She is advised to manage carbohydrate intake, particularly simple carbohydrates, to reduce insulin spikes. Family history of diabetes noted.  - Provided handout on insulin  resistance. - Advised on reducing intake of simple carbohydrates such as sweet, salty, bready, and white foods. - Encouraged consumption of protein-rich foods to mitigate insulin spikes.  Vitamin B12 deficiency Present, though improved from previous levels. She is not currently on supplementation but is now following a B12-rich eating plan. The deficiency is not attributed to malabsorption as there is no anemia present. - Continue B12-rich eating plan. - Will recheck B12 levels in three months.  Vitamin D deficiency Present but is being managed with over-the-counter vitamin D 4000 IU per week. Levels are currently adequate, but there is a risk of over-replacement as weight loss progresses. - Continue over-the-counter vitamin D 4000 IU per week. - Monitor vitamin D levels as weight loss progresses.     I personally spent a total of 40 minutes in the care of the patient today including preparing to see the patient, performing a medically appropriate evaluation of current problems, placing orders in the EMR, documenting clinical information in  the EMR, customized nutritional counseling for their specific health and social needs, independently interpreting results, discussing results with the patient and educating them on how these results can affect their health and weight, explaining the pathophysiology of obesity and how it is significantly more complex than eat less and exercise more, and discussing strategies to help avoid social eating challenges including holiday eating challenges.  Sara Torres was counseled on the importance of maintaining healthy lifestyle habits, including balanced nutrition, regular physical activity, and behavioral modifications, while taking antiobesity medication.  Patient verbalized understanding that medication is an adjunct to, not a replacement for, lifestyle changes and that the long-term success and weight maintenance depend on continued adherence to these  strategies.   Sara Torres was informed of the importance of frequent follow up visits to maximize her success with intensive lifestyle modifications for her obesity and obesity related health conditions as recommended by USPSTF and CMS guidelines   [name], MD

## 2024-08-14 ENCOUNTER — Ambulatory Visit (HOSPITAL_COMMUNITY)
Admission: RE | Admit: 2024-08-14 | Discharge: 2024-08-14 | Disposition: A | Discharge status: Home / Self Care | Source: Ambulatory Visit | Attending: Family Medicine | Admitting: Family Medicine

## 2024-08-14 DIAGNOSIS — R0609 Other forms of dyspnea: Secondary | ICD-10-CM | POA: Insufficient documentation

## 2024-08-14 LAB — ECHOCARDIOGRAM COMPLETE
Area-P 1/2: 5.62 cm2
S' Lateral: 3.22 cm

## 2024-08-18 NOTE — Progress Notes (Signed)
 SUBJECTIVE: Discussed the use of AI scribe software for clinical note transcription with the patient, who gave verbal consent to proceed.  Chief Complaint: Obesity  Interim History: She is down 6 lbs since her last visit.  Down 8 lbs overall   Sara Torres is here to discuss her progress with her obesity treatment plan. She is on the Category 2 Plan and states she is following her eating plan approximately 95 % of the time. She states she is not exercising .  Sara Torres is a 48 year old female who presents for follow-up of her obesity treatment plan.  She has been adhering to a dietary plan and has successfully lost six pounds since her last visit. Sundays are challenging due to her schedule, often leading to eating out. On a recent Sunday, she consumed salmon, broccoli, rice, and spinach dip without pita bread, followed by an apple. She is mindful of her food choices and portion sizes, especially during social gatherings. No excessive cravings are reported, although she misses rice.  She has a history of nephrotic syndrome since her teenage years, with infrequent flare-ups. She has not experienced a flare-up this year and is scheduled to see her nephrologist next month. She is not currently on steroids, using them only as needed for her kidney condition.  She experiences cramps in her legs and feet, often waking up with them. She uses mustard and Powerade to alleviate the cramps. She is unsure if she is getting the recommended amount of protein.  No issues with sleep or stress are reported. OBJECTIVE: Visit Diagnoses: Problem List Items Addressed This Visit     HTN (hypertension)   Obesity, starting BMI 45.48   Other Visit Diagnoses       Insulin  resistance    -  Primary     Muscle cramps         BMI 40.0-44.9, adult (HCC) Current BMI 44.4         Obesity with insulin  resistance Obesity with insulin  resistance, currently managed with dietary modifications. She has lost 6  pounds, indicating progress in weight management. Adipose tissue decreased from 156.8 pounds to 149 pounds. No excessive cravings reported. Muscle mass increased slightly despite lack of regular exercise. No current exercise regimen recommended until dietary plan is well-established. - Continue current dietary plan focusing on portion control and balanced meals. - Encouraged hydration and adequate protein intake. - Advised against starting a structured exercise regimen until dietary habits are well-established.   Insulin  Resistance Last fasting insulin  was 12.4- not at goal. A1c was 5.4- at goal. Polyphagia:No- Not when eating on plan Medication(s): None Lab Results  Component Value Date   HGBA1C 5.4 07/23/2024   Lab Results  Component Value Date   INSULIN  12.4 07/23/2024    Plan:  Continue working on nutrition plan to decrease simple carbohydrates, increase lean proteins and exercise to promote weight loss, improve glycemic control and prevent progression to Type 2 diabetes.     Hypertension Hypertension well controlled, asymptomatic, and no significant medication side effects noted.  Medication(s): labetalol  200 mg daily   Lasix 20 mg daily  BP Readings from Last 3 Encounters:  08/19/24 121/77  08/06/24 124/82  07/23/24 (!) 137/94   Lab Results  Component Value Date   CREATININE 1.01 (H) 07/23/2024   CREATININE 0.95 04/23/2018   CREATININE 0.93 04/22/2018   No results found for: GFR  Plan: Continue all antihypertensives at current dosages. Continue to work on nutrition plan to promote weight  loss and improve BP control.    Muscle cramps Intermittent muscle cramps, possibly related to dietary intake. No magnesium supplementation currently. Discussed potential benefits of magnesium for cramps, with caution due to kidney considerations.  - Consider magnesium supplementation with caution due to kidney health. - Increase intake of magnesium-rich foods such as cashews,  almonds, and spinach.   History of Nephrotic syndrome With infrequent flare-ups. No recent flare-ups reported.  Lab Results  Component Value Date   NA 138 07/23/2024   CL 97 07/23/2024   K 4.1 07/23/2024   CO2 23 07/23/2024   BUN 19 07/23/2024   CREATININE 1.01 (H) 07/23/2024   EGFR 68 07/23/2024   CALCIUM 9.9 07/23/2024   ALBUMIN 4.7 07/23/2024   GLUCOSE 83 07/23/2024   Weight loss may help mitigate long-term kidney risks. - Continue monitoring kidney health and follow up with nephrology as scheduled.  Vitals Temp: 98 F (36.7 C) BP: 121/77 Pulse Rate: 73 SpO2: 97 %   Anthropometric Measurements Height: 5' 9 (1.753 m) Weight: 300 lb (136.1 kg) BMI (Calculated): 44.28 Weight at Last Visit: 306 lb Weight Lost Since Last Visit: 6 lb Weight Gained Since Last Visit: 0 Starting Weight: 308 lb Total Weight Loss (lbs): 8 lb (3.629 kg) Peak Weight: 315 lb   Body Composition  Body Fat %: 49.6 % Fat Mass (lbs): 149 lbs Muscle Mass (lbs): 144 lbs Total Body Water (lbs): 104 lbs Visceral Fat Rating : 16   Other Clinical Data Fasting: No Labs: No Today's Visit #: 3 Starting Date: 07/23/24     ASSESSMENT AND PLAN:  Diet: Sara Torres is currently in the action stage of change. As such, her goal is to continue with weight loss efforts. She has agreed to Category 2 Plan.  Exercise: Sara Torres has been instructed to work up to a goal of 150 minutes of combined cardio and strengthening exercise per week for weight loss and overall health benefits.   Behavior Modification:  We discussed the following Behavioral Modification Strategies today: increasing lean protein intake, decreasing simple carbohydrates, increasing vegetables, increase H2O intake, increase high fiber foods, meal planning and cooking strategies, holiday eating strategies, avoiding temptations, and planning for success. We discussed various medication options to help Sara Torres with her weight loss efforts and we  both agreed to continue to work on nutritional and behavioral strategies to promote weight loss.  .  Follow up in 2-3 weeks.  She was informed of the importance of frequent follow up visits to maximize her success with intensive lifestyle modifications for her multiple health conditions.  Attestation Statements:   Reviewed by clinician on day of visit: allergies, medications, problem list, medical history, surgical history, family history, social history, and previous encounter notes.   Time spent on visit including pre-visit chart review and post-visit care and charting was 20 minutes.    Darby Shadwick, PA-C

## 2024-08-19 ENCOUNTER — Encounter (INDEPENDENT_AMBULATORY_CARE_PROVIDER_SITE_OTHER): Payer: Self-pay | Admitting: Physician Assistant

## 2024-08-19 ENCOUNTER — Ambulatory Visit (INDEPENDENT_AMBULATORY_CARE_PROVIDER_SITE_OTHER): Admitting: Physician Assistant

## 2024-08-19 VITALS — BP 121/77 | HR 73 | Temp 98.0°F | Ht 69.0 in | Wt 300.0 lb

## 2024-08-19 DIAGNOSIS — Z6841 Body Mass Index (BMI) 40.0 and over, adult: Secondary | ICD-10-CM

## 2024-08-19 DIAGNOSIS — I10 Essential (primary) hypertension: Secondary | ICD-10-CM | POA: Diagnosis not present

## 2024-08-19 DIAGNOSIS — E88819 Insulin resistance, unspecified: Secondary | ICD-10-CM | POA: Diagnosis not present

## 2024-08-19 DIAGNOSIS — R252 Cramp and spasm: Secondary | ICD-10-CM

## 2024-08-19 DIAGNOSIS — E559 Vitamin D deficiency, unspecified: Secondary | ICD-10-CM

## 2024-08-19 DIAGNOSIS — E039 Hypothyroidism, unspecified: Secondary | ICD-10-CM

## 2024-08-19 DIAGNOSIS — E538 Deficiency of other specified B group vitamins: Secondary | ICD-10-CM

## 2024-08-24 DIAGNOSIS — E890 Postprocedural hypothyroidism: Secondary | ICD-10-CM | POA: Diagnosis not present

## 2024-08-24 DIAGNOSIS — E042 Nontoxic multinodular goiter: Secondary | ICD-10-CM | POA: Diagnosis not present

## 2024-08-24 DIAGNOSIS — Z6841 Body Mass Index (BMI) 40.0 and over, adult: Secondary | ICD-10-CM | POA: Diagnosis not present

## 2024-08-31 NOTE — Progress Notes (Unsigned)
   SUBJECTIVE: Discussed the use of AI scribe software for clinical note transcription with the patient, who gave verbal consent to proceed.  Chief Complaint: Obesity  Interim History: ***  Sara Torres is here to discuss her progress with her obesity treatment plan. She is on the {HWW Weight Loss Plan:210964005} and states she {CHL AMB IS/IS NOT:210130109} following her eating plan approximately *** % of the time. She states she {CHL AMB IS/IS NOT:210130109} exercising *** minutes *** times per week.   OBJECTIVE: Visit Diagnoses: Problem List Items Addressed This Visit     HTN (hypertension)   Obesity, starting BMI 45.48   Other Visit Diagnoses       Insulin  resistance    -  Primary     Vitamin D  deficiency         B12 deficiency           No data recorded No data recorded No data recorded No data recorded   ASSESSMENT AND PLAN:  Diet: Sara Torres {CHL AMB IS/IS NOT:210130109} currently in the action stage of change. As such, her goal is to {HWW Weight Loss Efforts:210964006}. She {HAS HAS WNU:81165} agreed to {HWW Weight Loss Plan:210964005}.  Exercise: Sara Torres has been instructed {HWW Exercise:210964007} for weight loss and overall health benefits.   Behavior Modification:  We discussed the following Behavioral Modification Strategies today: {HWW Behavior Modification:210964008}. We discussed various medication options to help Sara Torres with her weight loss efforts and we both agreed to ***.  No follow-ups on file.Sara Torres She was informed of the importance of frequent follow up visits to maximize her success with intensive lifestyle modifications for her multiple health conditions.  Attestation Statements:   Reviewed by clinician on day of visit: allergies, medications, problem list, medical history, surgical history, family history, social history, and previous encounter notes.   Time spent on visit including pre-visit chart review and post-visit care and charting was *** minutes.     Chauncy Mangiaracina, PA-C

## 2024-09-02 ENCOUNTER — Ambulatory Visit (INDEPENDENT_AMBULATORY_CARE_PROVIDER_SITE_OTHER): Admitting: Physician Assistant

## 2024-09-02 ENCOUNTER — Encounter (INDEPENDENT_AMBULATORY_CARE_PROVIDER_SITE_OTHER): Payer: Self-pay | Admitting: Physician Assistant

## 2024-09-02 VITALS — BP 113/68 | HR 98 | Temp 98.6°F | Ht 69.0 in | Wt 296.0 lb

## 2024-09-02 DIAGNOSIS — I10 Essential (primary) hypertension: Secondary | ICD-10-CM

## 2024-09-02 DIAGNOSIS — E88819 Insulin resistance, unspecified: Secondary | ICD-10-CM | POA: Diagnosis not present

## 2024-09-02 DIAGNOSIS — E559 Vitamin D deficiency, unspecified: Secondary | ICD-10-CM

## 2024-09-02 DIAGNOSIS — I340 Nonrheumatic mitral (valve) insufficiency: Secondary | ICD-10-CM | POA: Diagnosis not present

## 2024-09-02 DIAGNOSIS — Z6841 Body Mass Index (BMI) 40.0 and over, adult: Secondary | ICD-10-CM

## 2024-09-02 DIAGNOSIS — E669 Obesity, unspecified: Secondary | ICD-10-CM | POA: Diagnosis not present

## 2024-09-02 DIAGNOSIS — E538 Deficiency of other specified B group vitamins: Secondary | ICD-10-CM

## 2024-09-02 DIAGNOSIS — I517 Cardiomegaly: Secondary | ICD-10-CM

## 2024-09-02 DIAGNOSIS — E039 Hypothyroidism, unspecified: Secondary | ICD-10-CM | POA: Diagnosis not present

## 2024-09-02 DIAGNOSIS — Z7185 Encounter for immunization safety counseling: Secondary | ICD-10-CM

## 2024-09-11 DIAGNOSIS — N181 Chronic kidney disease, stage 1: Secondary | ICD-10-CM | POA: Diagnosis not present

## 2024-09-11 DIAGNOSIS — Z23 Encounter for immunization: Secondary | ICD-10-CM | POA: Diagnosis not present

## 2024-09-11 DIAGNOSIS — I10 Essential (primary) hypertension: Secondary | ICD-10-CM | POA: Diagnosis not present

## 2024-09-11 DIAGNOSIS — D500 Iron deficiency anemia secondary to blood loss (chronic): Secondary | ICD-10-CM | POA: Diagnosis not present

## 2024-09-11 DIAGNOSIS — N049 Nephrotic syndrome with unspecified morphologic changes: Secondary | ICD-10-CM | POA: Diagnosis not present

## 2024-09-15 NOTE — Progress Notes (Signed)
 "  SUBJECTIVE: Discussed the use of AI scribe software for clinical note transcription with the patient, who gave verbal consent to proceed.  Chief Complaint: Obesity  Interim History: Sara Torres has maintained Sara Torres weight since Sara Torres last visit.  Down 12 lbs overall TBW loss of 3.9%  Sara Torres is here to discuss Sara Torres progress with Sara Torres obesity treatment plan. Sara Torres is on the Category 2 Plan and states Sara Torres is following Sara Torres eating plan approximately 95 % of the time. Sara Torres states Sara Torres is exercising stationary bike 30 minutes 1 times per week.  Sara Torres is a 48 year old female who presents for follow-up of Sara Torres obesity treatment plan.  Sara Torres adheres to Sara Torres obesity treatment plan approximately 95% of the time, which includes tracking calories and macros, focusing on whole foods, and maintaining adequate hydration. Sara Torres ensures not to skip meals and sleeps seven to nine hours per night. Physical activity is limited to riding a stationary bike for thirty minutes once a week. Despite the holiday season, Sara Torres successfully maintained Sara Torres weight over Thanksgiving, even with the consumption of some sweets and carbohydrates.  Sara Torres experiences muscle cramps every other night, which Sara Torres suspects may be due to a lack of magnesium. Sara Torres has not tried magnesium supplements, preferring to avoid additional pills. Instead, Sara Torres manages cramps by consuming mustard and drinking plenty of water. Sara Torres husband suggests pickle juice, which Sara Torres dislikes.  Sara Torres is concerned about not meeting Sara Torres protein needs and has recently purchased protein bars to address this. Sara Torres is exploring different brands and flavors to find a suitable option, being mindful of the nutritional content, including protein, sugar, and carbohydrate levels. Sara Torres is cautious about sugar alcohols that may cause gastrointestinal issues.  Sara Torres reports being less physically active over the past couple of weeks due to colder weather. Sara Torres acknowledges consuming saltier foods  recently due to social events, which may have contributed to a slight increase in water weight. Sara Torres plans to stay on track with Sara Torres program over the remainder of the holidays.   OBJECTIVE: Visit Diagnoses: Problem List Items Addressed This Visit     HTN (hypertension)   Obesity, starting BMI 45.48   Other Visit Diagnoses       Insulin  resistance    -  Primary     Muscle cramps         BMI 40.0-44.9, adult (HCC) Current BMI 43.7         Obesity  Following a category two plan approximately 95% of the time. Tracking calories and macros, consuming more whole foods, but not meeting protein needs. Drinking adequate water, not skipping meals, and sleeping 7-9 hours per night. Riding a stationary bike for 30 minutes once a week. Maintained weight during Thanksgiving, managed to eat more vegetables than carbs. Struggling with protein intake and experiencing cramps, possibly due to magnesium deficiency. Up in adipose and water weight, likely due to increased salt intake during recent events. Lost almost 4% of body weight so far. - Focus on increasing protein intake to drive metabolism. - Discussed careful use of protein supplements/bars and watch out for ingredients like sugar alcohols in bars which can cause GI upset - Consider magnesium supplements, such as Calm magnesium powder or gummies, to address cramps. - Increase frequency of stationary bike use. - Avoid salty foods and fried chicken to manage water weight. - Continue tracking calories and macros. - Consider Bilt protein bars for additional protein intake.  Insulin  resistance Aware of insulin  resistance and its implications.  Discussed the importance of movement post-meal to manage blood sugar and insulin  levels. Sara Torres HOMA-IR is 2.5 which is elevated. Optimal level < 1.9. This is complex condition associated with genetics, ectopic fat and lifestyle factors. Insulin  resistance may result in weight gain, abnormal cravings (particularly for  carbs) and fatigue. This may result in additional weight gain and lead to pre-diabetes and diabetes if untreated.   Lab Results  Component Value Date   HGBA1C 5.4 07/23/2024   Lab Results  Component Value Date   INSULIN  12.4 07/23/2024   Lab Results  Component Value Date   GLUCOSE 83 07/23/2024   GLUCOSE 82 08/06/2007   Plan:  We reviewed treatment options which include losing 7 to 10% of body weight, increasing physical activity to a 150 minutes a week of moderate intensity.Sara Torres may also be a candidate for pharmacoprophylaxis with metformin or incretin mimetic.  - Engage in movement, such as walking, within 30 minutes of eating to manage blood sugar and insulin  levels. -Continue working on nutrition plan to decrease simple carbohydrates, increase lean proteins and exercise to promote weight loss, improve glycemic control and prevent progression to Type 2 diabetes.   Essential hypertension On labetalol  200 mg daily and Lasix prn for edema. Reports no SE.  BP today was actually 125/77- in good range today.  BP Readings from Last 3 Encounters:  09/16/24 (!) 155/77  09/02/24 113/68  08/19/24 121/77   Lab Results  Component Value Date   NA 138 07/23/2024   CL 97 07/23/2024   K 4.1 07/23/2024   CO2 23 07/23/2024   BUN 19 07/23/2024   CREATININE 1.01 (H) 07/23/2024   EGFR 68 07/23/2024   CALCIUM 9.9 07/23/2024   ALBUMIN 4.7 07/23/2024   GLUCOSE 83 07/23/2024   Plan: Continue labetalol  200 mg daily and Lasix prn. Water weight was up today and discussed watching sodium/salt in foods and keeping sodium under 2300 mg daily.  Sara Torres reports eating some saltier foods over the past couple of weeks.  Continue to work on nutrition plan to promote weight loss and improve BP control.     Muscle cramps in legs Sara Torres experiences muscle cramps every other night, which Sara Torres suspects may be due to a lack of magnesium. Sara Torres has not tried magnesium supplements, preferring to avoid additional pills.  Instead, Sara Torres manages cramps by consuming mustard and drinking plenty of water. Sara Torres husband suggests pickle juice, which Sara Torres dislikes. Plan:   Discussed use of magnesium supplement like Calm magnesium for both muscle cramps and sleep.  Sara Torres is considering use of Magnesium supplement.  Discussed stretching leg muscles prior to sleep and maintaining good hydration- at least 80 oz of fluid daily.   Vitals Temp: 98 F (36.7 C) BP: (!) 155/77 Pulse Rate: 77 SpO2: 98 %   Anthropometric Measurements Height: 5' 9 (1.753 m) Weight: 296 lb (134.3 kg) BMI (Calculated): 43.69 Weight at Last Visit: 296 lb Weight Lost Since Last Visit: 0 Weight Gained Since Last Visit: 0 Starting Weight: 308 lb Total Weight Loss (lbs): 12 lb (5.443 kg) Peak Weight: 315 lb   Body Composition  Body Fat %: 50.5 % Fat Mass (lbs): 149.4 lbs Muscle Mass (lbs): 138.2 lbs Total Body Water (lbs): 107.6 lbs Visceral Fat Rating : 16   Other Clinical Data Fasting: No Labs: No Today's Visit #: 5 Starting Date: 07/23/24     ASSESSMENT AND PLAN:  Diet: Sara Torres is currently in the action stage of change. As such, Sara Torres goal is to  continue with weight loss efforts. Sara Torres has agreed to Category 2 Plan.  Exercise: Sara Torres has been instructed to work up to a goal of 150 minutes of combined cardio and strengthening exercise per week, to try a geriatric exercise plan, and that some exercise is better than none for weight loss and overall health benefits.   Behavior Modification:  We discussed the following Behavioral Modification Strategies today: increasing lean protein intake, decreasing simple carbohydrates, increasing vegetables, increase H2O intake, decreasing sodium intake, increase high fiber foods, meal planning and cooking strategies, holiday eating strategies, avoiding temptations, and planning for success. We discussed various medication options to help Sara Torres with Sara Torres weight loss efforts and we both agreed to  continue to work on nutritional and behavioral strategies to promote weight loss.  .  Return in about 3 weeks (around 10/07/2024).Sara Torres Sara Torres was informed of the importance of frequent follow up visits to maximize Sara Torres success with intensive lifestyle modifications for Sara Torres multiple health conditions.  Attestation Statements:   Reviewed by clinician on day of visit: allergies, medications, problem list, medical history, surgical history, family history, social history, and previous encounter notes.   Time spent on visit including pre-visit chart review and post-visit care and charting was 44 minutes.    Sara Fayad, PA-C  "

## 2024-09-16 ENCOUNTER — Encounter (INDEPENDENT_AMBULATORY_CARE_PROVIDER_SITE_OTHER): Payer: Self-pay | Admitting: Physician Assistant

## 2024-09-16 ENCOUNTER — Ambulatory Visit (INDEPENDENT_AMBULATORY_CARE_PROVIDER_SITE_OTHER): Admitting: Physician Assistant

## 2024-09-16 VITALS — BP 125/77 | HR 77 | Temp 98.0°F | Ht 69.0 in | Wt 296.0 lb

## 2024-09-16 DIAGNOSIS — E88819 Insulin resistance, unspecified: Secondary | ICD-10-CM

## 2024-09-16 DIAGNOSIS — E559 Vitamin D deficiency, unspecified: Secondary | ICD-10-CM

## 2024-09-16 DIAGNOSIS — E669 Obesity, unspecified: Secondary | ICD-10-CM | POA: Diagnosis not present

## 2024-09-16 DIAGNOSIS — Z6841 Body Mass Index (BMI) 40.0 and over, adult: Secondary | ICD-10-CM | POA: Diagnosis not present

## 2024-09-16 DIAGNOSIS — I10 Essential (primary) hypertension: Secondary | ICD-10-CM | POA: Diagnosis not present

## 2024-09-16 DIAGNOSIS — E538 Deficiency of other specified B group vitamins: Secondary | ICD-10-CM

## 2024-09-16 DIAGNOSIS — R252 Cramp and spasm: Secondary | ICD-10-CM | POA: Diagnosis not present

## 2024-10-06 ENCOUNTER — Encounter (INDEPENDENT_AMBULATORY_CARE_PROVIDER_SITE_OTHER): Payer: Self-pay | Admitting: Family Medicine

## 2024-10-06 ENCOUNTER — Ambulatory Visit (INDEPENDENT_AMBULATORY_CARE_PROVIDER_SITE_OTHER): Admitting: Family Medicine

## 2024-10-06 VITALS — BP 133/76 | HR 70 | Temp 98.0°F | Ht 69.0 in | Wt 291.0 lb

## 2024-10-06 DIAGNOSIS — E782 Mixed hyperlipidemia: Secondary | ICD-10-CM

## 2024-10-06 DIAGNOSIS — E785 Hyperlipidemia, unspecified: Secondary | ICD-10-CM | POA: Diagnosis not present

## 2024-10-06 DIAGNOSIS — E88819 Insulin resistance, unspecified: Secondary | ICD-10-CM

## 2024-10-06 DIAGNOSIS — E669 Obesity, unspecified: Secondary | ICD-10-CM

## 2024-10-06 DIAGNOSIS — E039 Hypothyroidism, unspecified: Secondary | ICD-10-CM | POA: Diagnosis not present

## 2024-10-06 DIAGNOSIS — Z6841 Body Mass Index (BMI) 40.0 and over, adult: Secondary | ICD-10-CM

## 2024-10-06 NOTE — Progress Notes (Signed)
 "  Office: [phone]  /  Fax: [phone]  WEIGHT SUMMARY AND BIOMETRICS  Anthropometric Measurements Height: 5' 9 (1.753 m) Weight: 291 lb (132 kg) BMI (Calculated): 42.95 Weight at Last Visit: 296 lb Weight Lost Since Last Visit: 5 lb Weight Gained Since Last Visit: 0 Starting Weight: 308 lb Total Weight Loss (lbs): 17 lb (7.711 kg) Peak Weight: 315 lb   Body Composition  Body Fat %: 50.6 % Fat Mass (lbs): 147.6 lbs Muscle Mass (lbs): 136.8 lbs Total Body Water (lbs): 104.8 lbs Visceral Fat Rating : 16   Other Clinical Data Fasting: no Labs: no Today's Visit #: 6 Starting Date: 07/23/24    Chief Complaint: OBESITY    History of Present Illness Sara Torres is a 49 year old female who presents for obesity treatment and progress assessment.  She has been following a category two eating plan 95% of the time, focusing on increasing her intake of fruits, vegetables, and protein, while ensuring adequate hydration and not skipping meals. She has achieved a weight loss of five pounds in the last three weeks, totaling a loss of seventeen pounds.  Her exercise routine includes using a stationary bike and treadmill, averaging two to three times a week for thirty minutes per session.  She generally sleeps seven to nine hours per night. She experienced stomach discomfort after a cheat meal, which discouraged her from indulging in high-carb or high-fat foods.  Her husband assists with meal preparation, and she typically consumes meals prepared by him, such as ground turkey with vegetables. She occasionally feels hungry after breakfast, indicating she may not be consuming enough food.  She participates in a religious fast on weekends, consuming only one meal a day.  Recent lab results showed a slightly elevated LDL and high insulin  levels, with a good A1c. She is on thyroid  medication.      PHYSICAL EXAM:  Blood pressure 133/76, pulse 70, temperature 98 F  (36.7 C), height 5' 9 (1.753 m), weight 291 lb (132 kg), SpO2 96%. Body mass index is 42.97 kg/m.  DIAGNOSTIC DATA REVIEWED BY MYSELF TODAY:  BMET    Component Value Date/Time   NA 138 07/23/2024 0920   K 4.1 07/23/2024 0920   CL 97 07/23/2024 0920   CO2 23 07/23/2024 0920   GLUCOSE 83 07/23/2024 0920   GLUCOSE 132 (H) 04/23/2018 0811   BUN 19 07/23/2024 0920   CREATININE 1.01 (H) 07/23/2024 0920   CALCIUM 9.9 07/23/2024 0920   GFRNONAA >60 04/23/2018 0811   GFRAA >60 04/23/2018 0811   Lab Results  Component Value Date   HGBA1C 5.4 07/23/2024   Lab Results  Component Value Date   INSULIN  12.4 07/23/2024   Lab Results  Component Value Date   TSH 3.970 07/23/2024   CBC    Component Value Date/Time   WBC 4.4 07/23/2024 0920   WBC 7.0 04/23/2018 0811   RBC 5.54 (H) 07/23/2024 0920   RBC 3.81 (L) 04/23/2018 0811   HGB 14.2 07/23/2024 0920   HCT 44.2 07/23/2024 0920   PLT 218 07/23/2024 0920   MCV 80 07/23/2024 0920   MCH 25.6 (L) 07/23/2024 0920   MCH 21.8 (L) 04/23/2018 0811   MCHC 32.1 07/23/2024 0920   MCHC 29.6 (L) 04/23/2018 0811   RDW 13.7 07/23/2024 0920   Iron Studies    Component Value Date/Time   IRON 53 07/23/2024 0920   TIBC 318 07/23/2024 0920   FERRITIN 99 07/23/2024 0920   IRONPCTSAT 17  07/23/2024 0920   Lipid Panel     Component Value Date/Time   CHOL 178 07/23/2024 0920   TRIG 87 07/23/2024 0920   HDL 62 07/23/2024 0920   LDLCALC 100 (H) 07/23/2024 0920   Hepatic Function Panel     Component Value Date/Time   PROT 8.0 07/23/2024 0920   ALBUMIN 4.7 07/23/2024 0920   AST 15 07/23/2024 0920   ALT 15 07/23/2024 0920   ALKPHOS 110 07/23/2024 0920   BILITOT 1.0 07/23/2024 0920      Component Value Date/Time   TSH 3.970 07/23/2024 0920   Nutritional Lab Results  Component Value Date   VD25OH 51.3 07/23/2024     Assessment and Plan Assessment & Plan Obesity Management is ongoing with a focus on dietary changes and  exercise. She adheres to the category two eating plan 95% of the time, incorporating more fruits, vegetables, and protein. She exercises two days a week for 30 minutes using a treadmill or stationary bike. She has lost 5 pounds in the last three weeks and a total of 17 pounds. She experiences fatigue, possibly due to insufficient protein intake during fasting periods. - Continue category two eating plan. - Increase exercise frequency to 150 minutes per week, considering shorter, more frequent sessions. - Incorporate a protein drink during fasting periods to maintain metabolism and energy levels.  Hypothyroidism Managed with levothyroxine  150 mcg. Weight loss may necessitate adjustment of thyroid  medication dosage. - Will check thyroid  function tests at next visit. - Will communicate with endocrinologist regarding potential adjustment of levothyroxine  dosage based on weight loss.  Hyperlipidemia LDL cholesterol was slightly elevated at 100 mg/dL in the last lab results. The goal is to maintain LDL well under 100 mg/dL. - Will repeat lipid panel at next visit to assess LDL levels.  Insulin  Resistance Previous labs indicated elevated insulin  levels. Monitoring is necessary to assess changes with ongoing weight loss and dietary modifications. - Will repeat HgA1c and insulin  levels at next visit to evaluate changes.      Patients who are on anti-obesity medications are counseled on the importance of maintaining healthy lifestyle habits, including balanced nutrition, regular physical activity, and behavioral modifications,  Medication is an adjunct to, not a replacement for, lifestyle changes and that the long-term success and weight maintenance depend on continued adherence to these strategies.   Sara Torres was informed of the importance of frequent follow up visits to maximize her success with intensive lifestyle modifications for her obesity and obesity related health conditions as recommended by  USPSTF and CMS guidelines  Sara Penton, MD   "

## 2024-10-20 ENCOUNTER — Ambulatory Visit (INDEPENDENT_AMBULATORY_CARE_PROVIDER_SITE_OTHER): Admitting: Family Medicine

## 2024-11-03 ENCOUNTER — Ambulatory Visit (INDEPENDENT_AMBULATORY_CARE_PROVIDER_SITE_OTHER): Admitting: Physician Assistant

## 2024-11-23 ENCOUNTER — Ambulatory Visit (INDEPENDENT_AMBULATORY_CARE_PROVIDER_SITE_OTHER): Admitting: Family Medicine
